# Patient Record
Sex: Male | Born: 1955 | Race: White | Hispanic: No | Marital: Married | Smoking: Former smoker
Health system: Southern US, Community
[De-identification: ages and names within clinical notes are randomized; demographics above are authoritative.]

## PROBLEM LIST (undated history)

## (undated) DIAGNOSIS — N529 Male erectile dysfunction, unspecified: Secondary | ICD-10-CM

## (undated) DIAGNOSIS — I10 Essential (primary) hypertension: Secondary | ICD-10-CM

## (undated) DIAGNOSIS — T7840XA Allergy, unspecified, initial encounter: Secondary | ICD-10-CM

## (undated) DIAGNOSIS — J45909 Unspecified asthma, uncomplicated: Secondary | ICD-10-CM

## (undated) DIAGNOSIS — J449 Chronic obstructive pulmonary disease, unspecified: Secondary | ICD-10-CM

## (undated) DIAGNOSIS — E119 Type 2 diabetes mellitus without complications: Secondary | ICD-10-CM

## (undated) DIAGNOSIS — M5431 Sciatica, right side: Secondary | ICD-10-CM

## (undated) HISTORY — DX: Type 2 diabetes mellitus without complications: E11.9

## (undated) HISTORY — PX: COLONOSCOPY: SHX174

## (undated) HISTORY — DX: Male erectile dysfunction, unspecified: N52.9

## (undated) HISTORY — DX: Sciatica, right side: M54.31

## (undated) HISTORY — DX: Essential (primary) hypertension: I10

## (undated) HISTORY — DX: Allergy, unspecified, initial encounter: T78.40XA

---

## 1988-03-04 DIAGNOSIS — Z87442 Personal history of urinary calculi: Secondary | ICD-10-CM

## 1988-03-04 HISTORY — DX: Personal history of urinary calculi: Z87.442

## 2015-12-06 DIAGNOSIS — N5203 Combined arterial insufficiency and corporo-venous occlusive erectile dysfunction: Secondary | ICD-10-CM | POA: Insufficient documentation

## 2017-06-25 LAB — HM COLONOSCOPY

## 2018-01-20 ENCOUNTER — Other Ambulatory Visit: Payer: Self-pay | Admitting: Acute Care

## 2018-01-20 DIAGNOSIS — R937 Abnormal findings on diagnostic imaging of other parts of musculoskeletal system: Secondary | ICD-10-CM

## 2018-01-20 DIAGNOSIS — M5441 Lumbago with sciatica, right side: Secondary | ICD-10-CM

## 2018-02-10 ENCOUNTER — Ambulatory Visit: Admission: RE | Admit: 2018-02-10 | Payer: BLUE CROSS/BLUE SHIELD | Source: Ambulatory Visit

## 2018-03-13 ENCOUNTER — Ambulatory Visit: Admission: RE | Admit: 2018-03-13 | Payer: BLUE CROSS/BLUE SHIELD | Source: Ambulatory Visit

## 2018-04-13 ENCOUNTER — Other Ambulatory Visit: Payer: Self-pay | Admitting: Acute Care

## 2018-04-13 DIAGNOSIS — R937 Abnormal findings on diagnostic imaging of other parts of musculoskeletal system: Secondary | ICD-10-CM

## 2018-04-13 DIAGNOSIS — M5440 Lumbago with sciatica, unspecified side: Secondary | ICD-10-CM

## 2018-04-13 DIAGNOSIS — M5442 Lumbago with sciatica, left side: Principal | ICD-10-CM

## 2018-04-13 DIAGNOSIS — G8929 Other chronic pain: Secondary | ICD-10-CM

## 2018-04-23 ENCOUNTER — Ambulatory Visit
Admission: RE | Admit: 2018-04-23 | Discharge: 2018-04-23 | Disposition: A | Discharge status: Home / Self Care | Payer: BLUE CROSS/BLUE SHIELD | Source: Ambulatory Visit | Attending: Acute Care | Admitting: Acute Care

## 2018-04-23 DIAGNOSIS — R937 Abnormal findings on diagnostic imaging of other parts of musculoskeletal system: Secondary | ICD-10-CM | POA: Insufficient documentation

## 2018-04-23 DIAGNOSIS — G8929 Other chronic pain: Secondary | ICD-10-CM

## 2018-04-23 DIAGNOSIS — M5442 Lumbago with sciatica, left side: Secondary | ICD-10-CM | POA: Diagnosis not present

## 2018-04-23 DIAGNOSIS — M5440 Lumbago with sciatica, unspecified side: Secondary | ICD-10-CM | POA: Diagnosis present

## 2018-09-03 LAB — HEPATIC FUNCTION PANEL
ALT: 33 (ref 10–40)
AST: 24 (ref 14–40)
Alkaline Phosphatase: 48 (ref 25–125)
Bilirubin, Total: 1.5

## 2018-09-03 LAB — BASIC METABOLIC PANEL
BUN: 11 (ref 4–21)
CO2: 27 — AB (ref 13–22)
Chloride: 96 — AB (ref 99–108)
Creatinine: 0.8 (ref 0.6–1.3)
Glucose: 122
Potassium: 4.3 (ref 3.4–5.3)
Sodium: 137 (ref 137–147)

## 2018-09-03 LAB — COMPREHENSIVE METABOLIC PANEL
Calcium: 9.9 (ref 8.7–10.7)
GFR calc non Af Amer: 95

## 2018-09-03 LAB — HEMOGLOBIN A1C: Hemoglobin A1C: 6.5

## 2019-07-20 ENCOUNTER — Encounter: Payer: Self-pay | Admitting: Family Medicine

## 2019-07-20 ENCOUNTER — Ambulatory Visit: Payer: BC Managed Care – PPO | Admitting: Family Medicine

## 2019-08-26 ENCOUNTER — Ambulatory Visit (INDEPENDENT_AMBULATORY_CARE_PROVIDER_SITE_OTHER): Payer: BC Managed Care – PPO | Admitting: Family Medicine

## 2019-08-26 ENCOUNTER — Other Ambulatory Visit: Payer: Self-pay

## 2019-08-26 ENCOUNTER — Ambulatory Visit: Payer: BC Managed Care – PPO | Admitting: Family Medicine

## 2019-08-26 ENCOUNTER — Encounter: Payer: Self-pay | Admitting: Family Medicine

## 2019-08-26 VITALS — BP 118/72 | HR 79 | Ht 70.0 in | Wt 178.0 lb

## 2019-08-26 DIAGNOSIS — J302 Other seasonal allergic rhinitis: Secondary | ICD-10-CM | POA: Insufficient documentation

## 2019-08-26 DIAGNOSIS — M5416 Radiculopathy, lumbar region: Secondary | ICD-10-CM

## 2019-08-26 DIAGNOSIS — I10 Essential (primary) hypertension: Secondary | ICD-10-CM | POA: Insufficient documentation

## 2019-08-26 DIAGNOSIS — Z7689 Persons encountering health services in other specified circumstances: Secondary | ICD-10-CM | POA: Diagnosis not present

## 2019-08-26 DIAGNOSIS — J449 Chronic obstructive pulmonary disease, unspecified: Secondary | ICD-10-CM | POA: Insufficient documentation

## 2019-08-26 DIAGNOSIS — E785 Hyperlipidemia, unspecified: Secondary | ICD-10-CM | POA: Insufficient documentation

## 2019-08-26 NOTE — Progress Notes (Signed)
Date:  08/26/2019   Name:  Bradley Bruce   DOB:  1955/12/28   MRN:  [id number]   Chief Complaint: Establish Care (New Patient. From [facility]) and Sciatica ([facility] told him they could do a surgery last christmas to help his sciatica but he was afraid and didn't do it at the time. Wants to know if he should still have the procedure. Gabapentin only helps him sleep but does not help the sciatica pain. Can be walking and gets stuck standing due to tingling and numbness in right leg and foot. )  Patient is a 64 year old male who presents for a establish care exam. The patient reports the following problems: lumbar radiculopathy/spondylothesis. Health maintenance has been reviewed up to date.  Back Pain This is a chronic problem. The current episode started more than 1 year ago. The problem occurs constantly. The problem has been waxing and waning since onset. The pain is present in the lumbar spine. The quality of the pain is described as aching. The pain radiates to the right foot, right knee and right thigh. The pain is moderate. The symptoms are aggravated by bending. Pertinent negatives include no abdominal pain, bladder incontinence, bowel incontinence, chest pain, dysuria, fever, headaches, numbness, paresis, paresthesias, tingling or weakness.    No results found for: CREATININE, BUN, NA, K, CL, CO2 No results found for: CHOL, HDL, LDLCALC, LDLDIRECT, TRIG, CHOLHDL No results found for: TSH No results found for: HGBA1C No results found for: WBC, HGB, HCT, MCV, PLT No results found for: ALT, AST, GGT, ALKPHOS, BILITOT   Review of Systems  Constitutional: Negative for chills and fever.  HENT: Negative for drooling, ear discharge, ear pain and sore throat.   Respiratory: Negative for cough, shortness of breath and wheezing.   Cardiovascular: Negative for chest pain, palpitations and leg swelling.  Gastrointestinal: Negative for abdominal pain, blood in stool, bowel incontinence,  constipation, diarrhea and nausea.  Endocrine: Negative for polydipsia.  Genitourinary: Negative for bladder incontinence, dysuria, frequency, hematuria and urgency.  Musculoskeletal: Positive for back pain. Negative for myalgias and neck pain.  Skin: Negative for rash.  Allergic/Immunologic: Negative for environmental allergies.  Neurological: Negative for dizziness, tingling, weakness, numbness, headaches and paresthesias.  Hematological: Does not bruise/bleed easily.  Psychiatric/Behavioral: Negative for suicidal ideas. The patient is not nervous/anxious.     Patient Active Problem List   Diagnosis Date Noted  . COPD (chronic obstructive pulmonary disease) ([place]) 08/26/2019  . Hyperlipidemia, unspecified 08/26/2019  . Hypertension 08/26/2019  . Seasonal allergies 08/26/2019  . Combined arterial insufficiency and corporo-venous occlusive erectile dysfunction 12/06/2015    No Known Allergies  History reviewed. No pertinent surgical history.  Social History   Tobacco Use  . Smoking status: Current Every Day Smoker    Packs/day: 1.00    Years: 46.00    Pack years: 46.00    Types: Cigarettes  . Smokeless tobacco: Never Used  Vaping Use  . Vaping Use: Never used  Substance Use Topics  . Alcohol use: Yes    Comment: rare- occasional beer  . Drug use: Never     Medication list has been reviewed and updated.  Current Meds  Medication Sig  . benazepril-hydrochlorthiazide (LOTENSIN HCT) 20-25 MG tablet Take 1 tablet by mouth daily.  . fluticasone (FLONASE) 50 MCG/ACT nasal spray Place 2 sprays into both nostrils daily.   Marland Kitchen gabapentin (NEURONTIN) 300 MG capsule Take 1 capsule by mouth at bedtime.  . metFORMIN (GLUCOPHAGE-XR) 750 MG 24  hr tablet Take 1 tablet by mouth daily.  Marland Kitchen omega-3 acid ethyl esters (LOVAZA) 1 g capsule Take 2 g by mouth 2 (two) times daily.  . sildenafil (REVATIO) 20 MG tablet Take 1-5 tablets by mouth as needed.    PHQ 2/9 Scores 08/26/2019  PHQ - 2  Score 0  PHQ- 9 Score 3    GAD 7 : Generalized Anxiety Score 08/26/2019  Nervous, Anxious, on Edge 0  Control/stop worrying 0  Worry too much - different things 0  Trouble relaxing 1  Restless 0  Easily annoyed or irritable 0  Afraid - awful might happen 0  Total GAD 7 Score 1  Anxiety Difficulty Not difficult at all    BP Readings from Last 3 Encounters:  08/26/19 118/72    Physical Exam Vitals and nursing note reviewed.  HENT:     Head: Normocephalic.     Right Ear: Tympanic membrane, ear canal and external ear normal.     Left Ear: Tympanic membrane, ear canal and external ear normal.     Nose: Nose normal.  Eyes:     General: No scleral icterus.       Right eye: No discharge.        Left eye: No discharge.     Conjunctiva/sclera: Conjunctivae normal.     Pupils: Pupils are equal, round, and reactive to light.  Neck:     Thyroid: No thyromegaly.     Vascular: No JVD.     Trachea: No tracheal deviation.  Cardiovascular:     Rate and Rhythm: Normal rate and regular rhythm.     Heart sounds: Normal heart sounds. No murmur heard.  No friction rub. No gallop.   Pulmonary:     Effort: No respiratory distress.     Breath sounds: Normal breath sounds. No wheezing or rales.  Abdominal:     General: Bowel sounds are normal.     Palpations: Abdomen is soft. There is no mass.     Tenderness: There is no abdominal tenderness. There is no guarding or rebound.  Musculoskeletal:        General: No tenderness. Normal range of motion.     Cervical back: Normal range of motion and neck supple.  Lymphadenopathy:     Cervical: No cervical adenopathy.  Skin:    General: Skin is warm.     Findings: No rash.  Neurological:     Mental Status: He is alert and oriented to person, place, and time.     Cranial Nerves: No cranial nerve deficit.     Deep Tendon Reflexes: Reflexes are normal and symmetric.     Wt Readings from Last 3 Encounters:  08/26/19 178 lb (80.7 kg)    BP  118/72 (BP Location: Left Arm, Patient Position: Sitting, Cuff Size: Normal)   Pulse 79   Ht 5\' 10"  (1.778 m)   Wt 178 lb (80.7 kg)   SpO2 96%   BMI 25.54 kg/m   Assessment and Plan: 1. Establishing care with new doctor, encounter for Patient establishing care with new physician.  Patient's chart was reviewed for previous encounters, most recent labs, most recent imaging, care everywhere specifically for lumbar radiculopathy/spondylolisthesis notes for Dr. Owens Shark.  2. Lumbar radiculopathy Chronic.  Episodic.  Waxes and wanes with current flares that results in inability to use right leg while walking.  This is very transient in the matter of seconds.  I suspect this has to do with radiculopathy.  We will refer  back to orthopedics at [facility] referral previous evaluations have been done as well as imaging and proceed with the pre-Covid plan of injections and possible consideration for surgery. - Ambulatory referral to Orthopedic Surgery

## 2019-09-08 ENCOUNTER — Encounter: Payer: BC Managed Care – PPO | Admitting: Family Medicine

## 2019-09-16 ENCOUNTER — Ambulatory Visit: Payer: BC Managed Care – PPO | Admitting: Family Medicine

## 2020-08-02 DIAGNOSIS — R0600 Dyspnea, unspecified: Secondary | ICD-10-CM

## 2020-08-02 DIAGNOSIS — C801 Malignant (primary) neoplasm, unspecified: Secondary | ICD-10-CM

## 2020-08-02 HISTORY — DX: Malignant (primary) neoplasm, unspecified: C80.1

## 2020-08-02 HISTORY — DX: Dyspnea, unspecified: R06.00

## 2020-08-22 ENCOUNTER — Ambulatory Visit: Payer: Medicare HMO | Admitting: Internal Medicine

## 2020-08-22 ENCOUNTER — Telehealth: Payer: Self-pay | Admitting: Internal Medicine

## 2020-08-22 ENCOUNTER — Encounter: Payer: Self-pay | Admitting: Internal Medicine

## 2020-08-22 ENCOUNTER — Other Ambulatory Visit: Payer: Self-pay

## 2020-08-22 VITALS — BP 98/60 | HR 77 | Temp 98.1°F | Ht 70.0 in | Wt 176.0 lb

## 2020-08-22 DIAGNOSIS — R918 Other nonspecific abnormal finding of lung field: Secondary | ICD-10-CM

## 2020-08-22 DIAGNOSIS — R059 Cough, unspecified: Secondary | ICD-10-CM

## 2020-08-22 NOTE — H&P (View-Only) (Signed)
Name: Bradley Bruce MRN: [id number] DOB: 08/18/1955     CONSULTATION DATE: 08/22/2020  REFERRING MD : Malvin Johns  CHIEF COMPLAINT: hemoptysis   HISTORY OF PRESENT ILLNESS: 65 year old pleasant white male seen today for abnormal CT chest report along with 72-month history of cough and hemoptysis  Patient was treated with multiple rounds of antibiotics and prednisone and cough persisted He was given a cough syrup but still had cough Primary care physician at [facility] and never been ordered CT chest  CT chest report indicates right suprahilar hilar mass extending into the right mainstem bronchus occluding the right upper lobe bronchus most likely consistent with malignancy adjacent lymph nodes could be involved as well Is also centrilobular and paraseptal emphysema Mass measuring 4 x 3 x 4 cm  These findings were reviewed in detail with the patient and family  Have advised patient that he will need another CT chest soon as possible to evaluate his lungs and will undergo bronchoscopy for further diagnostic evaluation  At this time I would treat this as a urgent situation and he will need to cancel his vacation  Patient with extensive smoking history 1.5 PPD for approx 50 years Stopped smoking 1 week ago  PAST MEDICAL HISTORY :   has a past medical history of Allergy, Diabetes mellitus without complication ([place]), ED (erectile dysfunction), Hypertension, and Sciatica of right side.  has no past surgical history on file. Prior to Admission medications   Medication Sig Start Date End Date Taking? Authorizing Provider  benazepril-hydrochlorthiazide (LOTENSIN HCT) 20-25 MG tablet Take 1 tablet by mouth daily. 05/31/19  Yes [provider]  fluticasone (FLONASE) 50 MCG/ACT nasal spray Place 2 sprays into both nostrils daily.  03/25/18  Yes [provider]  gabapentin (NEURONTIN) 300 MG capsule Take 1 capsule by mouth at bedtime. 06/21/19  Yes [provider]  metFORMIN  (GLUCOPHAGE-XR) 750 MG 24 hr tablet Take 1 tablet by mouth daily. 06/23/17  Yes [provider]  omega-3 acid ethyl esters (LOVAZA) 1 g capsule Take 2 g by mouth 2 (two) times daily.   Yes [provider]  sildenafil (REVATIO) 20 MG tablet Take 1-5 tablets by mouth as needed. 07/28/19  Yes [provider]   No Known Allergies  FAMILY HISTORY:  family history includes Cervical cancer in his mother; Heart disease in his father; Lung cancer in his sister. SOCIAL HISTORY:  reports that he has been smoking cigarettes. He has a 46.00 pack-year smoking history. He has never used smokeless tobacco. He reports current alcohol use. He reports that he does not use drugs.    Review of Systems:  Gen:  Denies  fever, sweats, chills weigh loss  HEENT: Denies blurred vision, double vision, ear pain, eye pain, hearing loss, nose bleeds, sore throat Cardiac:  No dizziness, chest pain or heaviness, chest tightness,edema, No JVD Resp:   +cough, -sputum production, +shortness of breath,+wheezing, +hemoptysis,  Gi: Denies swallowing difficulty, stomach pain, nausea or vomiting, diarrhea, constipation, bowel incontinence Gu:  Denies bladder incontinence, burning urine Ext:   Denies Joint pain, stiffness or swelling Skin: Denies  skin rash, easy bruising or bleeding or hives Endoc:  Denies polyuria, polydipsia , polyphagia or weight change Psych:   Denies depression, insomnia or hallucinations  Other:  All other systems negative   BP 98/60 (BP Location: Left Arm, Patient Position: Sitting, Cuff Size: Normal)   Pulse 77   Temp 98.1 F (36.7 C) (Temporal)   Ht 5\' 10"  (1.778 m)  Wt 176 lb (79.8 kg)   SpO2 95%   BMI 25.25 kg/m     SpO2: 95 % O2 Device: None (Room air)    Physical Examination:   GENERAL:NAD, no fevers, chills, no weakness no fatigue HEAD: Normocephalic, atraumatic.  EYES: PERLA, EOMI No scleral icterus.  NECK: Supple.  PULMONARY: CTA B/L +wheezing,   CARDIOVASCULAR: S1 and S2. Regular rate and rhythm. No murmurs GASTROINTESTINAL: Soft, nontender, nondistended. Positive bowel sounds.  MUSCULOSKELETAL: No swelling, clubbing, or edema.  NEUROLOGIC: No gross focal neurological deficits. 5/5 strength all extremities SKIN: No ulceration, lesions, rashes, or cyanosis.  PSYCHIATRIC: Insight, judgment intact. -depression -anxiety ALL OTHER ROS ARE NEGATIVE   MEDICATIONS: I have reviewed all medications and confirmed regimen as documented     ASSESSMENT AND PLAN SYNOPSIS  65 year old white male seen today for hemoptysis with abnormal report of CT chest showing right mainstem mass suprahilar mass with obstructive physiology in the right upper lobe based on CT scan reports from [facility] most likely representing a primary lung cancer related to his smoking  Patient will need an urgent CT scan of his chest at this time as I am unable to obtain images from his CT his chest  Patient will then probably need bronchoscopy for further diagnostic evaluation     Patient/Family are satisfied with Plan of action and management. All questions answered   Total time spent 47 minutes   Corrin Parker, M.D.  [facility]  Medical Director [place] Director [facility] Department

## 2020-08-22 NOTE — Patient Instructions (Signed)
Recommend repeat CT chest to soon as possible

## 2020-08-22 NOTE — Progress Notes (Addendum)
Name: Bradley Bruce MRN: [id number] DOB: 05-26-1955     CONSULTATION DATE: 08/22/2020  REFERRING MD : Malvin Johns  CHIEF COMPLAINT: hemoptysis   HISTORY OF PRESENT ILLNESS: 65 year old pleasant white male seen today for abnormal CT chest report along with 22-month history of cough and hemoptysis  Patient was treated with multiple rounds of antibiotics and prednisone and cough persisted He was given a cough syrup but still had cough Primary care physician at [facility] and never been ordered CT chest  CT chest report indicates right suprahilar hilar mass extending into the right mainstem bronchus occluding the right upper lobe bronchus most likely consistent with malignancy adjacent lymph nodes could be involved as well Is also centrilobular and paraseptal emphysema Mass measuring 4 x 3 x 4 cm  These findings were reviewed in detail with the patient and family  Have advised patient that he will need another CT chest soon as possible to evaluate his lungs and will undergo bronchoscopy for further diagnostic evaluation  At this time I would treat this as a urgent situation and he will need to cancel his vacation  Patient with extensive smoking history 1.5 PPD for approx 50 years Stopped smoking 1 week ago  PAST MEDICAL HISTORY :   has a past medical history of Allergy, Diabetes mellitus without complication ([place]), ED (erectile dysfunction), Hypertension, and Sciatica of right side.  has no past surgical history on file. Prior to Admission medications   Medication Sig Start Date End Date Taking? Authorizing Provider  benazepril-hydrochlorthiazide (LOTENSIN HCT) 20-25 MG tablet Take 1 tablet by mouth daily. 05/31/19  Yes [provider]  fluticasone (FLONASE) 50 MCG/ACT nasal spray Place 2 sprays into both nostrils daily.  03/25/18  Yes [provider]  gabapentin (NEURONTIN) 300 MG capsule Take 1 capsule by mouth at bedtime. 06/21/19  Yes [provider]  metFORMIN  (GLUCOPHAGE-XR) 750 MG 24 hr tablet Take 1 tablet by mouth daily. 06/23/17  Yes [provider]  omega-3 acid ethyl esters (LOVAZA) 1 g capsule Take 2 g by mouth 2 (two) times daily.   Yes [provider]  sildenafil (REVATIO) 20 MG tablet Take 1-5 tablets by mouth as needed. 07/28/19  Yes [provider]   No Known Allergies  FAMILY HISTORY:  family history includes Cervical cancer in his mother; Heart disease in his father; Lung cancer in his sister. SOCIAL HISTORY:  reports that he has been smoking cigarettes. He has a 46.00 pack-year smoking history. He has never used smokeless tobacco. He reports current alcohol use. He reports that he does not use drugs.    Review of Systems:  Gen:  Denies  fever, sweats, chills weigh loss  HEENT: Denies blurred vision, double vision, ear pain, eye pain, hearing loss, nose bleeds, sore throat Cardiac:  No dizziness, chest pain or heaviness, chest tightness,edema, No JVD Resp:   +cough, -sputum production, +shortness of breath,+wheezing, +hemoptysis,  Gi: Denies swallowing difficulty, stomach pain, nausea or vomiting, diarrhea, constipation, bowel incontinence Gu:  Denies bladder incontinence, burning urine Ext:   Denies Joint pain, stiffness or swelling Skin: Denies  skin rash, easy bruising or bleeding or hives Endoc:  Denies polyuria, polydipsia , polyphagia or weight change Psych:   Denies depression, insomnia or hallucinations  Other:  All other systems negative   BP 98/60 (BP Location: Left Arm, Patient Position: Sitting, Cuff Size: Normal)   Pulse 77   Temp 98.1 F (36.7 C) (Temporal)   Ht 5\' 10"  (1.778 m)  Wt 176 lb (79.8 kg)   SpO2 95%   BMI 25.25 kg/m     SpO2: 95 % O2 Device: None (Room air)    Physical Examination:   GENERAL:NAD, no fevers, chills, no weakness no fatigue HEAD: Normocephalic, atraumatic.  EYES: PERLA, EOMI No scleral icterus.  NECK: Supple.  PULMONARY: CTA B/L +wheezing,   CARDIOVASCULAR: S1 and S2. Regular rate and rhythm. No murmurs GASTROINTESTINAL: Soft, nontender, nondistended. Positive bowel sounds.  MUSCULOSKELETAL: No swelling, clubbing, or edema.  NEUROLOGIC: No gross focal neurological deficits. 5/5 strength all extremities SKIN: No ulceration, lesions, rashes, or cyanosis.  PSYCHIATRIC: Insight, judgment intact. -depression -anxiety ALL OTHER ROS ARE NEGATIVE   MEDICATIONS: I have reviewed all medications and confirmed regimen as documented     ASSESSMENT AND PLAN SYNOPSIS  65 year old white male seen today for hemoptysis with abnormal report of CT chest showing right mainstem mass suprahilar mass with obstructive physiology in the right upper lobe based on CT scan reports from [facility] most likely representing a primary lung cancer related to his smoking  Patient will need an urgent CT scan of his chest at this time as I am unable to obtain images from his CT his chest  Patient will then probably need bronchoscopy for further diagnostic evaluation     Patient/Family are satisfied with Plan of action and management. All questions answered   Total time spent 47 minutes   Corrin Parker, M.D.  [facility]  Medical Director [place] Director [place] Department

## 2020-08-22 NOTE — Telephone Encounter (Signed)
EBUS has been scheduled for 08/30/2020. VW:AQLR mass CPT: [id number], [id number]  PET scan has been ordered. Dr. Mortimer Fries has made patient aware.   Rodena Piety, please see bronch info.

## 2020-08-23 NOTE — Telephone Encounter (Signed)
Covid test 08/28/2020 between 8-12 and phone pre admit 08/25/2020 between 1-5.  Patient is aware of dates/times and voiced his understanding.  Nothing further needed at this time.

## 2020-08-23 NOTE — Telephone Encounter (Signed)
Patient is aware of date/time of bronch and voiced his understanding.

## 2020-08-23 NOTE — Telephone Encounter (Signed)
While I was getting the PET scan approved I spoke with [employer] and no PA is required through them for code 31652, [phone]  Refer # [id number]  I did call [facility] and they stated that PA not required for codes 31652, [id number] Refer # [id number]

## 2020-08-23 NOTE — Telephone Encounter (Signed)
Helene Kelp calling on behalf of pt. States that MyChart is showing a procedure scheduled for June 29th. Also received as phone call saying it was a bronchioscope with ultrasound. Pt and wife would like someone to call back to know what time to arrive and if there is anything they need to do to prepare for this procedure.

## 2020-08-25 ENCOUNTER — Encounter
Admission: RE | Admit: 2020-08-25 | Discharge: 2020-08-25 | Disposition: A | Discharge status: Home / Self Care | Payer: Medicare HMO | Source: Ambulatory Visit | Attending: Internal Medicine | Admitting: Internal Medicine

## 2020-08-25 ENCOUNTER — Other Ambulatory Visit: Payer: Self-pay

## 2020-08-25 DIAGNOSIS — R5383 Other fatigue: Secondary | ICD-10-CM

## 2020-08-25 DIAGNOSIS — R042 Hemoptysis: Secondary | ICD-10-CM

## 2020-08-25 DIAGNOSIS — R053 Chronic cough: Secondary | ICD-10-CM

## 2020-08-25 HISTORY — DX: Hemoptysis: R04.2

## 2020-08-25 HISTORY — DX: Chronic cough: R05.3

## 2020-08-25 HISTORY — DX: Other fatigue: R53.83

## 2020-08-25 HISTORY — DX: Unspecified asthma, uncomplicated: J45.909

## 2020-08-25 HISTORY — DX: Chronic obstructive pulmonary disease, unspecified: J44.9

## 2020-08-25 NOTE — Patient Instructions (Signed)
INSTRUCTIONS FOR SURGERY     Your surgery is scheduled for:   Wednesday, June 29TH     To find out your arrival time for the day of surgery,          please call [phone] between 1 pm and 3 pm on :  Tuesday, June 28TH  COVID TEST AND BLOOD WORK ON Monday, June 27TH     When you arrive for surgery, report to the [place]. ONCE THEY HAVE COMPLETED THEIR PROCESS, PROCEED TO THE SECOND FLOOR AND SIGN IN AT THE SURGERY DESK.    REMEMBER: Instructions that are not followed completely may result in serious medical risk,  up to and including death, or upon the discretion of your surgeon and anesthesiologist,            your surgery may need to be rescheduled.  __X__ 1. Do not eat food after midnight the night before your procedure.                    No gum, candy, lozenger, tic tacs, tums or hard candies.                  ABSOLUTELY NOTHING SOLID IN YOUR MOUTH AFTER MIDNIGHT                    You may drink unlimited clear liquids up to 2 hours before you are scheduled to arrive for surgery.                   Do not drink anything within those 2 hours unless you need to take medicine, then take the                   smallest amount you need.  Clear liquids include:  water, apple juice without pulp,                   any flavor Gatorade, Black coffee, black tea.  Sugar may be added but no dairy/ honey /lemon.                        Broth and jello is not considered a clear liquid.  __x__  2. On the morning of surgery, please brush your teeth with toothpaste and water. You may rinse with                  mouthwash if you wish but DO NOT SWALLOW TOOTHPASTE OR MOUTHWASH.  __X___3. NO alcohol for 24 hours before or after surgery.  __x___ 4.  Do NOT smoke or use e-cigarettes for 24 HOURS PRIOR TO SURGERY.                      DO NOT  Use any chewable tobacco products for at least 6 hours  prior to surgery.  __x___ 5. If you start any new medication after this appointment and prior to surgery, please                   Bring it  with you on the day of surgery.  ___x__ 6. Notify your doctor if there is any change in your medical condition, such as fever,                   infection, vomitting,diarrhea or any open sores.  __x___ 7.  USE ANTIBACTERIAL SOAP  as instructed, the night before surgery OR the day of surgery.                   Once you have washed with this soap, do NOT use any of the following: Powders, perfumes                    or lotions. Please do not wear make up, hairpins, clips or nail polish. You may wear deodorant.                                                          Men may shave their face and neck.                     DO NOT wear ANY jewelry on the day of surgery. If there are rings that are too tight to                    remove easily, please address this prior to the surgery day. Piercings need to be removed.                                                                     NO METAL ON YOUR BODY.                    Do NOT bring any valuables.  If you came to Pre-Admit testing then you will not need license,                     insurance card or credit card.  If you will be staying overnight, please either leave your things in                     the car or have your family be responsible for these items.                     [facility] IS NOT RESPONSIBLE FOR BELONGINGS OR VALUABLES.  ___X__ 8. DO NOT wear contact lenses on surgery day.  You may not have dentures,                     Hearing aides, contacts or glasses in the operating room. These items can be                    Placed in the Recovery Room to receive immediately after surgery.  __x___ 9. IF YOU ARE SCHEDULED TO GO HOME ON THE SAME DAY, YOU MUST                   Have someone to drive  you home and to stay with you  for the first 24 hours.                    Have an arrangement prior  to arriving on surgery day.  ___x__ 10. Take the following medications on the morning of surgery with a sip of water:                              1. FLONASE                     2. ADVAIR                     3.  __X__  12. STOP ALL ASPIRIN PRODUCTS AS OF TODAY, 08/25/20                       THIS INCLUDES BC POWDERS / GOODIES POWDER  __x___ 13. STOP Anti-inflammatories as of TODAY, 08/25/20                      This includes IBUPROFEN / MOTRIN / ADVIL / ALEVE/ NAPROXYN                    YOU MAY TAKE TYLENOL ANY TIME PRIOR TO SURGERY.  ___X__ 14.  Stop supplements until after surgery.                     This includes: LOVAZA // APPLE CIDER VINEGAR // VITAMIN D                   ___X___16.  Stop Metformin 2 full days prior to surgery.  LAST DOSE: Sunday pm 08/27/20.                     Do NOT take any diabetes medications on surgery day.  ___x___17.  Continue to take the following medications but do not take on the morning of surgery:                                  LOTENSIN //   ___X___18. Wear clean and comfortable clothing to the hospital.

## 2020-08-28 ENCOUNTER — Other Ambulatory Visit: Payer: Self-pay

## 2020-08-28 ENCOUNTER — Other Ambulatory Visit
Admission: RE | Admit: 2020-08-28 | Discharge: 2020-08-28 | Disposition: A | Discharge status: Home / Self Care | Payer: Medicare HMO | Source: Ambulatory Visit | Attending: Internal Medicine | Admitting: Internal Medicine

## 2020-08-28 DIAGNOSIS — Z20822 Contact with and (suspected) exposure to covid-19: Secondary | ICD-10-CM | POA: Insufficient documentation

## 2020-08-28 DIAGNOSIS — I10 Essential (primary) hypertension: Secondary | ICD-10-CM | POA: Insufficient documentation

## 2020-08-28 DIAGNOSIS — Z01818 Encounter for other preprocedural examination: Secondary | ICD-10-CM | POA: Insufficient documentation

## 2020-08-28 DIAGNOSIS — Z01810 Encounter for preprocedural cardiovascular examination: Secondary | ICD-10-CM

## 2020-08-28 LAB — CBC
HCT: 38.2 % — ABNORMAL LOW (ref 39.0–52.0)
Hemoglobin: 13.5 g/dL (ref 13.0–17.0)
MCH: 30.1 pg (ref 26.0–34.0)
MCHC: 35.3 g/dL (ref 30.0–36.0)
MCV: 85.3 fL (ref 80.0–100.0)
Platelets: 283 10*3/uL (ref 150–400)
RBC: 4.48 MIL/uL (ref 4.22–5.81)
RDW: 11.9 % (ref 11.5–15.5)
WBC: 8 10*3/uL (ref 4.0–10.5)
nRBC: 0 % (ref 0.0–0.2)

## 2020-08-28 LAB — BASIC METABOLIC PANEL
Anion gap: 7 (ref 5–15)
BUN: 18 mg/dL (ref 8–23)
CO2: 28 mmol/L (ref 22–32)
Calcium: 9.2 mg/dL (ref 8.9–10.3)
Chloride: 100 mmol/L (ref 98–111)
Creatinine, Ser: 0.67 mg/dL (ref 0.61–1.24)
GFR, Estimated: >60 mL/min (ref >60)
Glucose, Bld: 138 mg/dL — ABNORMAL HIGH (ref 70–99)
Potassium: 3.2 mmol/L — ABNORMAL LOW (ref 3.5–5.1)
Sodium: 135 mmol/L (ref 135–145)

## 2020-08-28 LAB — SARS CORONAVIRUS 2 (TAT 6-24 HRS): SARS Coronavirus 2: NEGATIVE

## 2020-08-28 MED ORDER — ALBUTEROL SULFATE HFA 108 (90 BASE) MCG/ACT IN AERS: 2.0000 | INHALATION_SPRAY | Freq: Four times a day (QID) | RESPIRATORY_TRACT | 2 refills | Status: DC | PRN

## 2020-08-28 MED ORDER — PREDNISONE 20 MG PO TABS: 40.0000 mg | ORAL_TABLET | Freq: Every day | ORAL | 0 refills | Status: DC

## 2020-08-28 NOTE — Telephone Encounter (Signed)
[facility] [address]  Will this effect wednsday procedure should I not take it 24 hours before procedure  Dr. Mortimer Fries, please advise.

## 2020-08-28 NOTE — Telephone Encounter (Signed)
Dr. Mortimer Fries, please advise. Thanks

## 2020-08-28 NOTE — Telephone Encounter (Signed)
LM x1 for patient.

## 2020-08-29 ENCOUNTER — Ambulatory Visit
Admission: RE | Admit: 2020-08-29 | Discharge: 2020-08-29 | Disposition: A | Discharge status: Home / Self Care | Payer: Self-pay | Source: Ambulatory Visit | Attending: Internal Medicine | Admitting: Internal Medicine

## 2020-08-29 ENCOUNTER — Other Ambulatory Visit: Payer: Self-pay | Admitting: Internal Medicine

## 2020-08-29 DIAGNOSIS — R918 Other nonspecific abnormal finding of lung field: Secondary | ICD-10-CM

## 2020-08-30 ENCOUNTER — Encounter: Payer: Self-pay | Admitting: Internal Medicine

## 2020-08-30 ENCOUNTER — Encounter
Admission: RE | Disposition: A | Discharge status: Home / Self Care | Payer: Self-pay | Source: Home / Self Care | Attending: Internal Medicine

## 2020-08-30 ENCOUNTER — Ambulatory Visit: Payer: Medicare HMO | Admitting: Anesthesiology

## 2020-08-30 ENCOUNTER — Other Ambulatory Visit: Payer: Self-pay

## 2020-08-30 ENCOUNTER — Ambulatory Visit
Admission: RE | Admit: 2020-08-30 | Discharge: 2020-08-30 | Disposition: A | Discharge status: Home / Self Care | Payer: Medicare HMO | Attending: Internal Medicine | Admitting: Internal Medicine

## 2020-08-30 ENCOUNTER — Encounter: Payer: Self-pay | Admitting: *Deleted

## 2020-08-30 DIAGNOSIS — C3401 Malignant neoplasm of right main bronchus: Secondary | ICD-10-CM | POA: Diagnosis not present

## 2020-08-30 DIAGNOSIS — R918 Other nonspecific abnormal finding of lung field: Secondary | ICD-10-CM

## 2020-08-30 DIAGNOSIS — Z7984 Long term (current) use of oral hypoglycemic drugs: Secondary | ICD-10-CM | POA: Diagnosis not present

## 2020-08-30 DIAGNOSIS — R042 Hemoptysis: Secondary | ICD-10-CM | POA: Diagnosis present

## 2020-08-30 DIAGNOSIS — F17210 Nicotine dependence, cigarettes, uncomplicated: Secondary | ICD-10-CM | POA: Insufficient documentation

## 2020-08-30 DIAGNOSIS — Z79899 Other long term (current) drug therapy: Secondary | ICD-10-CM | POA: Insufficient documentation

## 2020-08-30 DIAGNOSIS — J432 Centrilobular emphysema: Secondary | ICD-10-CM | POA: Insufficient documentation

## 2020-08-30 DIAGNOSIS — Z801 Family history of malignant neoplasm of trachea, bronchus and lung: Secondary | ICD-10-CM | POA: Insufficient documentation

## 2020-08-30 DIAGNOSIS — C3491 Malignant neoplasm of unspecified part of right bronchus or lung: Secondary | ICD-10-CM

## 2020-08-30 HISTORY — PX: VIDEO BRONCHOSCOPY WITH ENDOBRONCHIAL ULTRASOUND: SHX6177

## 2020-08-30 LAB — GLUCOSE, CAPILLARY
Glucose-Capillary: 98 mg/dL (ref 70–99)
Glucose-Capillary: 128 mg/dL — ABNORMAL HIGH (ref 70–99)

## 2020-08-30 SURGERY — BRONCHOSCOPY, WITH EBUS
Anesthesia: General | Laterality: Right

## 2020-08-30 MED ORDER — FAMOTIDINE 20 MG PO TABS: 20.0000 mg | ORAL_TABLET | Freq: Once | ORAL | Status: AC

## 2020-08-30 MED ORDER — PROPOFOL 10 MG/ML IV BOLUS
INTRAVENOUS | Status: AC
  Filled 2020-08-30: qty 20

## 2020-08-30 MED ORDER — CHLORHEXIDINE GLUCONATE 0.12 % MT SOLN: 15.0000 mL | Freq: Once | OROMUCOSAL | Status: DC

## 2020-08-30 MED ORDER — MIDAZOLAM HCL 2 MG/2ML IJ SOLN
INTRAMUSCULAR | Status: AC
  Filled 2020-08-30: qty 2

## 2020-08-30 MED ORDER — ORAL CARE MOUTH RINSE: 15.0000 mL | Freq: Once | OROMUCOSAL | Status: DC

## 2020-08-30 MED ORDER — PROPOFOL 10 MG/ML IV BOLUS
INTRAVENOUS | Status: DC | PRN
  Administered 2020-08-30: 150 mg via INTRAVENOUS

## 2020-08-30 MED ORDER — ONDANSETRON HCL 4 MG/2ML IJ SOLN
INTRAMUSCULAR | Status: DC | PRN
  Administered 2020-08-30: 4 mg via INTRAVENOUS

## 2020-08-30 MED ORDER — SODIUM CHLORIDE 0.9 % IV SOLN: INTRAVENOUS | Status: DC

## 2020-08-30 MED ORDER — FENTANYL CITRATE (PF) 100 MCG/2ML IJ SOLN
INTRAMUSCULAR | Status: DC | PRN
  Administered 2020-08-30: 50 ug via INTRAVENOUS

## 2020-08-30 MED ORDER — FENTANYL CITRATE (PF) 100 MCG/2ML IJ SOLN: 25.0000 ug | INTRAMUSCULAR | Status: DC | PRN

## 2020-08-30 MED ORDER — OXYCODONE HCL 5 MG/5ML PO SOLN: 5.0000 mg | Freq: Once | ORAL | Status: DC | PRN

## 2020-08-30 MED ORDER — CHLORHEXIDINE GLUCONATE 0.12 % MT SOLN
OROMUCOSAL | Status: AC
  Administered 2020-08-30: 15 mL
  Filled 2020-08-30: qty 15

## 2020-08-30 MED ORDER — FAMOTIDINE 20 MG PO TABS
ORAL_TABLET | ORAL | Status: AC
  Administered 2020-08-30: 20 mg via ORAL
  Filled 2020-08-30: qty 1

## 2020-08-30 MED ORDER — FENTANYL CITRATE (PF) 100 MCG/2ML IJ SOLN
INTRAMUSCULAR | Status: AC
  Filled 2020-08-30: qty 2

## 2020-08-30 MED ORDER — ROCURONIUM BROMIDE 100 MG/10ML IV SOLN
INTRAVENOUS | Status: DC | PRN
  Administered 2020-08-30: 50 mg via INTRAVENOUS

## 2020-08-30 MED ORDER — MIDAZOLAM HCL 2 MG/2ML IJ SOLN
INTRAMUSCULAR | Status: DC | PRN
  Administered 2020-08-30: 2 mg via INTRAVENOUS

## 2020-08-30 MED ORDER — PHENYLEPHRINE HCL (PRESSORS) 10 MG/ML IV SOLN
INTRAVENOUS | Status: DC | PRN
  Administered 2020-08-30: 100 ug via INTRAVENOUS

## 2020-08-30 MED ORDER — OXYCODONE HCL 5 MG PO TABS: 5.0000 mg | ORAL_TABLET | Freq: Once | ORAL | Status: DC | PRN

## 2020-08-30 MED ORDER — DEXAMETHASONE SODIUM PHOSPHATE 10 MG/ML IJ SOLN
INTRAMUSCULAR | Status: DC | PRN
  Administered 2020-08-30: 10 mg via INTRAVENOUS

## 2020-08-30 MED ORDER — SUGAMMADEX SODIUM 200 MG/2ML IV SOLN
INTRAVENOUS | Status: DC | PRN
  Administered 2020-08-30: 400 mg via INTRAVENOUS

## 2020-08-30 MED ORDER — LIDOCAINE HCL (CARDIAC) PF 100 MG/5ML IV SOSY
PREFILLED_SYRINGE | INTRAVENOUS | Status: DC | PRN
  Administered 2020-08-30: 100 mg via INTRAVENOUS

## 2020-08-30 NOTE — Transfer of Care (Signed)
Immediate Anesthesia Transfer of Care Note  Patient: Bradley Bruce  Procedure(s) Performed: VIDEO BRONCHOSCOPY WITH ENDOBRONCHIAL ULTRASOUND (Right)  Patient Location: PACU  Anesthesia Type:General  Level of Consciousness: drowsy  Airway & Oxygen Therapy: Patient Spontanous Breathing and Patient connected to face mask oxygen  Post-op Assessment: Report given to RN  Post vital signs: stable  Last Vitals:  Vitals Value Taken Time  BP 138/89 08/30/20 1318  Temp    Pulse 77 08/30/20 1322  Resp 21 08/30/20 1322  SpO2 97 % 08/30/20 1322  Vitals shown include unvalidated device data.  Last Pain:  Vitals:   08/30/20 1057  TempSrc: Oral      Patients Stated Pain Goal: 0 (09/81/19 1478)  Complications: No notable events documented.

## 2020-08-30 NOTE — Anesthesia Procedure Notes (Signed)
Procedure Name: Intubation Date/Time: 08/30/2020 12:49 PM Performed by: Lerry Liner, CRNA Pre-anesthesia Checklist: Patient identified, Emergency Drugs available, Suction available and Patient being monitored Patient Re-evaluated:Patient Re-evaluated prior to induction Oxygen Delivery Method: Circle system utilized Preoxygenation: Pre-oxygenation with 100% oxygen Induction Type: IV induction Ventilation: Mask ventilation without difficulty Laryngoscope Size: McGraph and 4 Grade View: Grade II Tube type: Oral Tube size: 8.5 mm Number of attempts: 1 Airway Equipment and Method: Stylet and Oral airway Placement Confirmation: ETT inserted through vocal cords under direct vision, positive ETCO2 and breath sounds checked- equal and bilateral Secured at: 23 cm Tube secured with: Tape Dental Injury: Teeth and Oropharynx as per pre-operative assessment

## 2020-08-30 NOTE — Interval H&P Note (Signed)
History and Physical Interval Note:  08/30/2020 12:12 PM  Bradley Bruce  has presented today for surgery, with the diagnosis of LUNG MASS.  The various methods of treatment have been discussed with the patient and family. After consideration of risks, benefits and other options for treatment, the patient has consented to  Procedure(s): [place] (Right) BRONCHOSCOPY as a surgical intervention.  The patient's history has been reviewed, patient examined, no change in status, stable for surgery.  I have reviewed the patient's chart and labs.  Questions were answered to the patient's satisfaction.      The Risks and Benefits of the Bronchoscopy procedure with EBUS and BRONCHOSCOPY  were explained to patient/family.  I have discussed the risk for Acute Bleeding, increased chance of Infection, increased chance of Respiratory Failure and Cardiac Arrest, increased chance of pneumothorax and collapsed lung, as well as increased Stroke and Death.  I have also explained to avoid all types of NSAIDs 1 week prior to procedure date  to decrease chance of bleeding, and also to avoid food and drinks the midnight prior to procedure.  The procedure consists of a video camera with a light source to be placed and inserted  into the lungs to  look for abnormal tissue and to obtain tissue samples by using needle and biopsy tools.  The patient/family understand the risks and benefits and have agreed to proceed with procedure.   Flora Lipps

## 2020-08-30 NOTE — CV Procedure (Signed)
  PROCEDURE: BRONCHOSCOPY Therapeutic Aspiration of Tracheobronchial Tree  PROCEDURE DATE: 08/30/2020  TIME:  NAMEBekim Bruce  DOB:1955/09/28  MRN: [id number]    Indications/Preliminary Diagnosis: RT LUNG MASS  Consent: (Place X beside choice/s below)  The benefits, risks and possible complications of the procedure were        explained to:  __x_ patient  __x_ patient's family  ___ other:___________  who verbalized understanding and gave:  ___ verbal  ___ written  __x_ verbal and written  ___ telephone  ___ other:________ consent.      Unable to obtain consent; procedure performed on emergent basis.     Other:       PRESEDATION ASSESSMENT: History and Physical has been performed. Patient meds and allergies have been reviewed. Presedation airway examination has been performed and documented. Baseline vital signs, sedation score, oxygenation status, and cardiac rhythm were reviewed. Patient was deemed to be in satisfactory condition to undergo the procedure.   SEE ANETHSEIOLOGY RECORDS      PROCEDURE DETAILS: Timeout performed and correct patient, name, & ID confirmed. Following prep per Pulmonary policy, appropriate sedation was administered. The Bronchoscope was inserted in to oral cavity with bite block in place. Therapeutic aspiration of Tracheobronchial tree was performed.  Airway exam proceeded with findings, technical procedures, and specimen collection as noted below. At the end of exam the scope was withdrawn without incident. Impression and Plan as noted below.           Airway Prep (Place X beside choice below)   1% Transtracheal Lidocaine Anesthetization 7 cc  x Patient prepped per Bronchoscopy Lab Policy       Insertion Route (Place X beside choice below)   Nasal   Oral  x Endotracheal Tube   Tracheostomy   INTRAPROCEDURE MEDICATIONS:  Sedative/Narcotic Amt Dose   Versed  mg   EPI 6 cc's   COLD SALINE 40 cc's         TECHNICAL PROCEDURES:  (Place X beside choice below)   Procedures  Description    None     Electrocautery     Cryotherapy     Balloon Dilatation     Bronchography     Stent Placement     Therapeutic Aspiration     Laser/Argon Plasma    Brachytherapy Catheter Placement    Foreign Body Removal         SPECIMENS (Sites): (Place X beside choice below)  Specimens Description   No Specimens Obtained     Washings    Lavage   x Biopsies 5 endobronchial biopsies   Fine Needle Aspirates   x Brushings 2 brushings   Sputum    FINDINGS: large endobronchial RT main stem mass extending into RBI-friable, easliy bleeds,+inflammation RT UPPER LOBE is 100% occluded by mass  ESTIMATED BLOOD LOSS: 759 cc's  COMPLICATIONS/RESOLUTION: none   IMPRESSION:POST-PROCEDURE DX:  RT Main Stem endobronchial Mass   RECOMMENDATION/PLAN:   Needs Oncology evaluation ASAP Follow up Path results  See pics below           Maretta Bees Patricia Pesa, M.D.  [facility]  Medical Director [place] Director [place] Department

## 2020-08-30 NOTE — Discharge Instructions (Signed)

## 2020-08-30 NOTE — Anesthesia Postprocedure Evaluation (Signed)
Anesthesia Post Note  Patient: Wilian Kwong  Procedure(s) Performed: VIDEO BRONCHOSCOPY WITH ENDOBRONCHIAL ULTRASOUND (Right)  Patient location during evaluation: PACU Anesthesia Type: General Level of consciousness: awake and alert Pain management: pain level controlled Vital Signs Assessment: post-procedure vital signs reviewed and stable Respiratory status: spontaneous breathing, nonlabored ventilation, respiratory function stable and patient connected to nasal cannula oxygen Cardiovascular status: blood pressure returned to baseline and stable Postop Assessment: no apparent nausea or vomiting Anesthetic complications: no   No notable events documented.   Last Vitals:  Vitals:   08/30/20 1400 08/30/20 1407  BP: 123/81 96/67  Pulse: 73 78  Resp: (!) 21 20  Temp: 36.6 C 36.7 C  SpO2: 92% 94%    Last Pain:  Vitals:   08/30/20 1407  TempSrc:   PainSc: 0-No pain                 Precious Haws Honorio Devol

## 2020-08-30 NOTE — Anesthesia Preprocedure Evaluation (Signed)
Anesthesia Evaluation  Patient identified by MRN, date of birth, ID band Patient awake    Reviewed: Allergy & Precautions, NPO status , Patient's Chart, lab work & pertinent test results  History of Anesthesia Complications Negative for: history of anesthetic complications  Airway Mallampati: III  TM Distance: <3 FB Neck ROM: full    Dental  (+) Chipped, Poor Dentition, Missing   Pulmonary shortness of breath and with exertion, asthma , COPD, former smoker,    Pulmonary exam normal        Cardiovascular Exercise Tolerance: Good hypertension, (-) angina(-) Past MI and (-) DOE Normal cardiovascular exam     Neuro/Psych  Neuromuscular disease negative psych ROS   GI/Hepatic negative GI ROS, Neg liver ROS,   Endo/Other  diabetes, Type 2, Oral Hypoglycemic Agents  Renal/GU      Musculoskeletal   Abdominal   Peds  Hematology negative hematology ROS (+)   Anesthesia Other Findings Past Medical History: No date: Allergy No date: Asthma 08/2020: Cancer [facility])     Comment:  lung mass probably metastatic 08/25/2020: Chronic cough No date: COPD (chronic obstructive pulmonary disease) (HCC) No date: Diabetes mellitus without complication ([place]) 59/9357: Dyspnea     Comment:  worsening now No date: ED (erectile dysfunction) 08/25/2020: Fatigue 08/25/2020: Hemoptysis     Comment:  still continues for a few days, then stops for a few               days and then starts up again 1990: History of kidney stones     Comment:  passed stone without surgery No date: Hypertension No date: Sciatica of right side     Comment:  Takes gabapentin to help  Past Surgical History: No date: COLONOSCOPY  BMI    Body Mass Index: 24.39 kg/m      Reproductive/Obstetrics negative OB ROS                             Anesthesia Physical Anesthesia Plan  ASA: 3  Anesthesia Plan: General ETT   Post-op Pain  Management:    Induction: Intravenous  PONV Risk Score and Plan: Ondansetron, Dexamethasone, Midazolam and Treatment may vary due to age or medical condition  Airway Management Planned: Oral ETT  Additional Equipment:   Intra-op Plan:   Post-operative Plan: Extubation in OR  Informed Consent: I have reviewed the patients History and Physical, chart, labs and discussed the procedure including the risks, benefits and alternatives for the proposed anesthesia with the patient or authorized representative who has indicated his/her understanding and acceptance.     Dental Advisory Given  Plan Discussed with: Anesthesiologist, CRNA and Surgeon  Anesthesia Plan Comments: (Patient consented for risks of anesthesia including but not limited to:  - adverse reactions to medications - damage to eyes, teeth, lips or other oral mucosa - nerve damage due to positioning  - sore throat or hoarseness - Damage to heart, brain, nerves, lungs, other parts of body or loss of life  Patient voiced understanding.)        Anesthesia Quick Evaluation

## 2020-08-31 ENCOUNTER — Encounter: Payer: Self-pay | Admitting: Internal Medicine

## 2020-08-31 ENCOUNTER — Ambulatory Visit
Admission: RE | Admit: 2020-08-31 | Discharge: 2020-08-31 | Disposition: A | Discharge status: Home / Self Care | Payer: Medicare HMO | Source: Ambulatory Visit | Attending: Internal Medicine | Admitting: Internal Medicine

## 2020-08-31 DIAGNOSIS — R918 Other nonspecific abnormal finding of lung field: Secondary | ICD-10-CM | POA: Diagnosis present

## 2020-08-31 DIAGNOSIS — C3401 Malignant neoplasm of right main bronchus: Secondary | ICD-10-CM | POA: Insufficient documentation

## 2020-08-31 LAB — GLUCOSE, CAPILLARY: Glucose-Capillary: 109 mg/dL — ABNORMAL HIGH (ref 70–99)

## 2020-08-31 MED ORDER — FLUDEOXYGLUCOSE F - 18 (FDG) INJECTION
9.3000 | Freq: Once | INTRAVENOUS | Status: AC | PRN
  Administered 2020-08-31: 9.85 via INTRAVENOUS

## 2020-09-01 ENCOUNTER — Encounter: Payer: Self-pay | Admitting: *Deleted

## 2020-09-01 ENCOUNTER — Encounter: Payer: Self-pay | Admitting: Oncology

## 2020-09-01 ENCOUNTER — Inpatient Hospital Stay: Payer: Medicare HMO | Attending: Oncology | Admitting: Oncology

## 2020-09-01 ENCOUNTER — Inpatient Hospital Stay: Payer: Medicare HMO

## 2020-09-01 VITALS — BP 133/84 | HR 71 | Temp 97.8°F | Ht 70.1 in | Wt 179.0 lb

## 2020-09-01 DIAGNOSIS — Z8249 Family history of ischemic heart disease and other diseases of the circulatory system: Secondary | ICD-10-CM | POA: Diagnosis not present

## 2020-09-01 DIAGNOSIS — Z808 Family history of malignant neoplasm of other organs or systems: Secondary | ICD-10-CM | POA: Diagnosis not present

## 2020-09-01 DIAGNOSIS — Z7189 Other specified counseling: Secondary | ICD-10-CM

## 2020-09-01 DIAGNOSIS — Z87891 Personal history of nicotine dependence: Secondary | ICD-10-CM

## 2020-09-01 DIAGNOSIS — R0789 Other chest pain: Secondary | ICD-10-CM

## 2020-09-01 DIAGNOSIS — C3411 Malignant neoplasm of upper lobe, right bronchus or lung: Secondary | ICD-10-CM

## 2020-09-01 DIAGNOSIS — Z801 Family history of malignant neoplasm of trachea, bronchus and lung: Secondary | ICD-10-CM

## 2020-09-01 DIAGNOSIS — R5383 Other fatigue: Secondary | ICD-10-CM

## 2020-09-01 DIAGNOSIS — Z79899 Other long term (current) drug therapy: Secondary | ICD-10-CM | POA: Diagnosis not present

## 2020-09-01 DIAGNOSIS — R053 Chronic cough: Secondary | ICD-10-CM

## 2020-09-01 DIAGNOSIS — C3491 Malignant neoplasm of unspecified part of right bronchus or lung: Secondary | ICD-10-CM

## 2020-09-01 DIAGNOSIS — Z87442 Personal history of urinary calculi: Secondary | ICD-10-CM | POA: Diagnosis not present

## 2020-09-01 LAB — CYTOLOGY - NON PAP

## 2020-09-01 LAB — SURGICAL PATHOLOGY

## 2020-09-01 NOTE — Progress Notes (Signed)
New pt states he is here to get results. He admits to seeking his care moving forward to [facility].

## 2020-09-01 NOTE — Progress Notes (Signed)
Hematology/Oncology Consult note [facility] Telephone:([phone] [phone]   Patient Care Team: [place], [place] Shirline Frees, MD as PCP - General (Family Medicine) Telford Nab, RN as Oncology Nurse Navigator  REFERRING PROVIDER: Shelly Bombard Carleene Mains*  CHIEF COMPLAINTS/REASON FOR VISIT:  Evaluation of lung cancer  HISTORY OF PRESENTING ILLNESS:   Bradley Bruce is a  65 y.o.  male with PMH listed below was seen in consultation at the request of  Lorenza Cambridge*  for evaluation of lung cancer  Patient has experienced 3 months history of cough and hemoptysis.  He was treated with several rounds of antibiotics and prednisone and cough persisted. 08/17/2020,  CT chest without contrast was obtained which showed right suprahilar mass possibly extending into the right mainstem bronchus and occluding the right upper lobe bronchus consistent with malignancy.  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  Images are not available to me 08/22/2020.  Establish care with pulmonology Dr. Mortimer Fries 08/30/2020, status post bronchoscopy.  Right mainstem bronchus biopsy showed non-small cell carcinoma, favor squamous cell carcinoma.  Right lung mainstem bronchus brushing is positive for non-small cell lung cancer, favor SCC. 08/31/2020, PET scan was obtained and pending to be officially read by radiologist.  Patient was referred to establish care with hematology oncology for evaluation and management. Patient was accompanied by her wife today. Review of system positive for persistent cough and some anterior chest discomfort with deep breathing.  Denies any unintentional weight loss Patient also plans to go to [facility] for an opinion.  Per patient's wife, an MRI brain will be arranged at [place]  Former smoker, quitted in May 2022.  69-pack-year smoking  history  Review of Systems  Constitutional:  Positive for fatigue. Negative for appetite change, chills, fever and unexpected weight change.  HENT:   Negative for hearing loss and voice change.   Eyes:  Negative for eye problems and icterus.  Respiratory:  Negative for chest tightness, cough and shortness of breath.   Cardiovascular:  Negative for chest pain and leg swelling.  Gastrointestinal:  Negative for abdominal distention and abdominal pain.  Endocrine: Negative for hot flashes.  Genitourinary:  Negative for difficulty urinating, dysuria and frequency.   Musculoskeletal:  Negative for arthralgias.  Skin:  Negative for itching and rash.  Neurological:  Negative for light-headedness and numbness.  Hematological:  Negative for adenopathy. Does not bruise/bleed easily.  Psychiatric/Behavioral:  Negative for confusion.    MEDICAL HISTORY:  Past Medical History:  Diagnosis Date   Allergy    Asthma    Cancer ([place]) 08/2020   lung mass probably metastatic   Chronic cough 08/25/2020   COPD (chronic obstructive pulmonary disease) ([place])    Diabetes mellitus without complication ([place])    Dyspnea 08/2020   worsening now   ED (erectile dysfunction)    Fatigue 08/25/2020   Hemoptysis 08/25/2020   still continues for a few days, then stops for a few days and then starts up again   History of kidney stones 1990   passed stone without surgery   Hypertension    Sciatica of right side    Takes gabapentin to help    SURGICAL HISTORY: Past Surgical History:  Procedure Laterality Date   COLONOSCOPY     VIDEO BRONCHOSCOPY WITH ENDOBRONCHIAL ULTRASOUND Right 08/30/2020   Procedure: VIDEO BRONCHOSCOPY WITH ENDOBRONCHIAL ULTRASOUND;  Surgeon: Flora Lipps, MD;  Location: [facility] ORS;  Service: Pulmonary;  Laterality: Right;    SOCIAL HISTORY: Social History   Socioeconomic History   Marital status: Married    Spouse name: Helene Kelp   Number of children: 2   Years of education: Not on file    Highest education level: Not on file  Occupational History   Occupation: Radiation protection practitioner area Copywriter, advertising    Comment: retired  Tobacco Use   Smoking status: Former    Packs/day: 1.50    Years: 46.00    Pack years: 69.00    Types: Cigarettes    Quit date: 07/21/2020    Years since quitting: 0.1   Smokeless tobacco: Never  Vaping Use   Vaping Use: Never used  Substance and Sexual Activity   Alcohol use: Yes    Comment: rare- occasional beer   Drug use: Never   Sexual activity: Yes  Other Topics Concern   Not on file  Social History Narrative   Patient lives with his wife of 53 years.  Patient feels safe in his home.   Has a dog and a cat.   Social Determinants of Health   Financial Resource Strain: Not on file  Food Insecurity: Not on file  Transportation Needs: Not on file  Physical Activity: Not on file  Stress: Not on file  Social Connections: Not on file  Intimate Partner Violence: Not on file    FAMILY HISTORY: Family History  Problem Relation Age of Onset   Cervical cancer Mother    Heart disease Father    Lung cancer Sister     ALLERGIES:  has No Known Allergies.  MEDICATIONS:  Current Outpatient Medications  Medication Sig Dispense Refill   albuterol (VENTOLIN HFA) 108 (90 Base) MCG/ACT inhaler Inhale 2 puffs into the lungs every 6 (six) hours as needed for shortness of breath or wheezing.     APPLE CIDER VINEGAR PO Take 1,200 mg by mouth daily.     benazepril-hydrochlorthiazide (LOTENSIN HCT) 20-25 MG tablet Take 1 tablet by mouth daily.     Camphor-Eucalyptus-Menthol (VICKS VAPORUB EX) Apply 1 application topically at bedtime.     Cholecalciferol (VITAMIN D) 50 MCG (2000 UT) tablet Take 2,000 Units by mouth daily.     fluticasone (FLONASE) 50 MCG/ACT nasal spray Place 2 sprays into both nostrils daily as needed for allergies.     fluticasone-salmeterol (ADVAIR) 250-50 MCG/ACT AEPB Inhale 1 puff into the lungs in the morning and at bedtime.     gabapentin  (NEURONTIN) 300 MG capsule Take 300 mg by mouth at bedtime.     metFORMIN (GLUCOPHAGE-XR) 750 MG 24 hr tablet Take 750 mg by mouth at bedtime.     omega-3 acid ethyl esters (LOVAZA) 1 g capsule Take 1 g by mouth daily.     predniSONE (DELTASONE) 20 MG tablet Take 2 tablets (40 mg total) by mouth daily with breakfast. 14 tablet 0   rosuvastatin (CRESTOR) 20 MG tablet Take 20 mg by mouth daily.     No current facility-administered medications for this visit.     PHYSICAL EXAMINATION: ECOG PERFORMANCE STATUS: 0 - Asymptomatic Vitals:   09/01/20 0933  BP: 133/84  Pulse: 71  Temp: 97.8 F (36.6 C)  SpO2: 99%   Filed Weights   09/01/20 0933  Weight: 179 lb (81.2 kg)    Physical Exam Constitutional:      General: He is not in acute distress. HENT:     Head: Normocephalic and atraumatic.  Eyes:     General: No  scleral icterus. Cardiovascular:     Rate and Rhythm: Normal rate and regular rhythm.     Heart sounds: Normal heart sounds.  Pulmonary:     Effort: Pulmonary effort is normal. No respiratory distress.     Breath sounds: Wheezing present.  Abdominal:     General: Bowel sounds are normal. There is no distension.     Palpations: Abdomen is soft.  Musculoskeletal:        General: No deformity. Normal range of motion.     Cervical back: Normal range of motion and neck supple.  Skin:    General: Skin is warm and dry.     Findings: No erythema or rash.  Neurological:     Mental Status: He is alert and oriented to person, place, and time. Mental status is at baseline.     Cranial Nerves: No cranial nerve deficit.     Coordination: Coordination normal.  Psychiatric:        Mood and Affect: Mood normal.    LABORATORY DATA:  I have reviewed the data as listed Lab Results  Component Value Date   WBC 8.0 08/28/2020   HGB 13.5 08/28/2020   HCT 38.2 (L) 08/28/2020   MCV 85.3 08/28/2020   PLT 283 08/28/2020   Recent Labs    08/28/20 0924  NA 135  K 3.2*  CL 100   CO2 28  GLUCOSE 138*  BUN 18  CREATININE 0.67  CALCIUM 9.2  GFRNONAA >60   Iron/TIBC/Ferritin/ %Sat No results found for: IRON, TIBC, FERRITIN, IRONPCTSAT    RADIOGRAPHIC STUDIES: I have personally reviewed the radiological images as listed and agreed with the findings in the report. DG Outside Films Chest  Result Date: 08/29/2020 This examination belongs to an outside facility and is stored here for comparison purposes only.  Contact the originating outside institution for any associated report or interpretation.     ASSESSMENT & PLAN:  1. Squamous cell carcinoma lung, right ([place])   2. Goals of care, counseling/discussion    #Right squamous cell carcinoma of the lung.  Pathology report was reviewed with patient and wife. CT image findings were reviewed and discussed with patient. PET scan images independently reviewed by me and reviewed with patient. Pending official reading. Clinically stage III lung SCC Recommend brain MRI to complete staging.  Recommend about concurrent chemotherapy- carboplatin AUC 2 and Taxol 45mg /m2 -weekly with radiation.  After concurrent RT and chemotherapy, patient will have about 4 weeks of break and CT chest will be obtained to determine disease status. If no progression of disease, patient will start maintenance immunotherapy with Durvaluamab every 2 weeks for 1 year.   Patient has an appointment with radiation oncology next week. Patient also has an appointment with [facility] for second opinion.  Pending his decision, if he decides to have treatment locally at [facility], we will schedule patient for chemotherapy education. Check NGS    All questions were answered. The patient knows to call the clinic with any problems questions or concerns.  cc Bynum, Milele Likivu Ku*   Thank you for this kind referral and the opportunity to participate in the care of this patient. A copy of today's note is routed to referring provider    Earlie Server, MD,  PhD Hematology Oncology [facility] Pager- [phone] 09/01/2020

## 2020-09-01 NOTE — Progress Notes (Signed)
Met with patient and his wife during initial consult with Dr. Tasia Catchings. All questions answered during visit. Pathology and PET scan results are still pending at the time of visit. Pt stated that is seeking second opinion at [facility] next week. Contact info given and informed to contact if wishes to resume treatment locally. Pt and his wife verbalized understanding. Nothing further needed at this time.

## 2020-09-05 ENCOUNTER — Ambulatory Visit: Payer: Medicare HMO | Admitting: Radiation Oncology

## 2020-09-08 ENCOUNTER — Encounter: Payer: Self-pay | Admitting: *Deleted

## 2020-09-08 NOTE — Progress Notes (Signed)
Pt has cancelled appts at [facility] since will be seeking cancer care at [facility]. Pt has established care with Dr. Merleen Nicely and Dr. Loletta Specter to start lung cancer treatment. Pt has contact info for any future questions or needs. Nothing further needed at this time.

## 2020-09-11 ENCOUNTER — Institutional Professional Consult (permissible substitution): Payer: BC Managed Care – PPO | Admitting: Pulmonary Disease

## 2020-09-14 DIAGNOSIS — R918 Other nonspecific abnormal finding of lung field: Secondary | ICD-10-CM

## 2020-09-14 DIAGNOSIS — C3491 Malignant neoplasm of unspecified part of right bronchus or lung: Secondary | ICD-10-CM

## 2020-09-26 ENCOUNTER — Ambulatory Visit: Payer: Medicare HMO | Admitting: Adult Health

## 2021-04-17 ENCOUNTER — Telehealth: Payer: Self-pay | Admitting: Adult Health

## 2021-04-17 ENCOUNTER — Encounter: Payer: Self-pay | Admitting: Adult Health

## 2021-04-17 ENCOUNTER — Other Ambulatory Visit: Payer: Self-pay

## 2021-04-17 ENCOUNTER — Ambulatory Visit: Payer: Medicare Other | Admitting: Adult Health

## 2021-04-17 VITALS — BP 120/78 | HR 85 | Temp 98.3°F | Ht 70.0 in | Wt 182.0 lb

## 2021-04-17 DIAGNOSIS — C3491 Malignant neoplasm of unspecified part of right bronchus or lung: Secondary | ICD-10-CM

## 2021-04-17 DIAGNOSIS — R918 Other nonspecific abnormal finding of lung field: Secondary | ICD-10-CM

## 2021-04-17 DIAGNOSIS — R053 Chronic cough: Secondary | ICD-10-CM

## 2021-04-17 DIAGNOSIS — J449 Chronic obstructive pulmonary disease, unspecified: Secondary | ICD-10-CM

## 2021-04-17 MED ORDER — FLUTICASONE-SALMETEROL 250-50 MCG/ACT IN AEPB: 1.0000 | INHALATION_SPRAY | Freq: Two times a day (BID) | RESPIRATORY_TRACT | 5 refills | Status: DC

## 2021-04-17 MED ORDER — FLUTICASONE-SALMETEROL 250-50 MCG/ACT IN AEPB: 1.0000 | INHALATION_SPRAY | Freq: Two times a day (BID) | RESPIRATORY_TRACT | 3 refills | Status: DC

## 2021-04-17 NOTE — Progress Notes (Signed)
@Patient  ID: Bradley Bruce, male    DOB: 03/13/1955, 66 y.o.   MRN: [id number]  Chief Complaint  Patient presents with   Follow-up    Referring provider: Lorenza Cambridge*  HPI: 66 year old male former smoker followed for non-small cell lung cancer, favored squamous cell carcinoma, stage IIIa, COPD  TEST/EVENTS :  05/31/20 Developed a cough and was treated with antibiotics and steroids for presumed bronchitis/ pneumonia - 08/07/20 Cough persisted after treatment and developed associated hemoptysis and fatigue - 08/17/20: CT Chest showing 4.0x3.6x4.3cm right suprahilar/hilar mass possibly extending into the right mainstem bronchus, irregular nodular and groundglass opacities in RUL suspected to represent postobstructive infection versus lymphangitic carcinomatosis - 08/30/20: EBUS with biopsy at [facility] with path pos NSCLC, favored to represent squamous cell carcinoma - 08/31/2020: PET CT at [facility] showing 4.4 cm hypermetabolic mass in the right hilum with mediastinal involvement in the right paratracheal space, consistent with primary bronchogenic carcinoma; and 1.8 cm hypermetabolic lymph node in right infrahilar region, consistent with metastatic disease.  - 09/01/2020: Patient had initial outpatient oncology visit at [facility] with recommendation of concurrent chemotherapy- carboplatin AUC 2 and Taxol 45mg /m2 -weekly with radiation; but patient with plan for second opinion at [facility] - 09/05/2020: Brain MRI completed with no evidence of intracranial metastatic disease. Patient with initial visit with [facility] with recommendation for chemoradiation; case also reviewed by surgery on this date and determined not to be a surgical candidate - 09/06/2020: Patient with initial visit with medical oncology at [place] with Dr. Loletta Specter - 7/18- 10/27/20 completed definitive daily RT. Concurrent weekly chemo Norma Fredrickson / pacli  - 11/27/20 CT  chest decrease in right perihilar mass w new RUL GGO favored to be radiation change.  - 12/25/20 post COVID. - 01/01/21 MRI brain NED - 02/28/21: CT Chest: increased R paratracheal opacity likely post-radiation; underlying obstruction of the bronchus may represent postobstructive pneumonitis - 03/28/21 C5 durvalumab  04/17/2021 Follow up : COPD , Lung cancer  Patient presents for 36-month follow-up.  Patient has underlying COPD, no PFT on record .  Patient remains on Advair twice daily.  Patient says his insurance is no longer going to cover Advair and needs to be changed to [employer] .  Patient says overall breathing is doing okay.  Since having radiation and undergoing chemo he says that he gets tired easier.  He is remaining active.  Goes to the [facility] several times a week.  Also stays active at home.  He says he does get short of breath with heavy activity such as going up an incline or stairs.  Patient says he has a daily cough he says cough can be quite severe at times.  He denies any fever, hemoptysis or discolored mucus.  Patient has tried Mucinex without much relief of cough.  Patient is on ACE inhibitor.  Has some sinus drainage.  No significant reflux.  Is on Prilosec daily.  But taken at nighttime.  No albuterol use. Flu and COVID vaccines are up-to-date.  Patient is scheduled for Pneumovax next week.  As above patient was diagnosed with lung cancer June 2022.  Patient is managed by [facility] oncology.  Patient has completed concurrent chemotherapy with carboplatin and Taxol.  He completed radiation early August 2022.  MRI brain was negative for metastasis.  Most recent CT chest February 28, 2021 showed increased right peritracheal opacity likely postradiation, underlying obstruction of the bronchus may be representative of a postobstructive pneumonitis.  Patient is currently on [place].  Patient says this is more tolerable.  He has fatigue and low energy but symptoms are manageable.  He denies any  unintentional weight loss.  Hemoptysis.  No Known Allergies  Immunization History  Administered Date(s) Administered   Fluad Quad(high Dose 65+) 01/05/2021   Influenza Split 02/06/2005   Influenza,inj,Quad PF,6+ Mos 11/21/2015, 11/21/2016, 12/02/2017   Influenza-Unspecified 11/26/2012, 12/19/2014   PFIZER(Purple Top)SARS-COV-2 Vaccination 05/12/2019, 06/06/2019   Pneumococcal Polysaccharide-23 09/01/2014   Tdap 08/04/2004, 01/04/2011   Zoster Recombinat (Shingrix) 11/21/2016, 12/24/2017   Zoster, Live 12/28/2014    Past Medical History:  Diagnosis Date   Allergy    Asthma    Cancer ([place]) 08/2020   lung mass probably metastatic   Chronic cough 08/25/2020   COPD (chronic obstructive pulmonary disease) ([place])    Diabetes mellitus without complication ([place])    Dyspnea 08/2020   worsening now   ED (erectile dysfunction)    Fatigue 08/25/2020   Hemoptysis 08/25/2020   still continues for a few days, then stops for a few days and then starts up again   History of kidney stones 1990   passed stone without surgery   Hypertension    Sciatica of right side    Takes gabapentin to help    Tobacco History: Social History   Tobacco Use  Smoking Status Former   Packs/day: 1.50   Years: 46.00   Pack years: 69.00   Types: Cigarettes   Quit date: 07/21/2020   Years since quitting: 0.7  Smokeless Tobacco Never   Counseling given: Not Answered   Outpatient Medications Prior to Visit  Medication Sig Dispense Refill   albuterol (VENTOLIN HFA) 108 (90 Base) MCG/ACT inhaler Inhale 2 puffs into the lungs every 6 (six) hours as needed for shortness of breath or wheezing.     benazepril-hydrochlorthiazide (LOTENSIN HCT) 20-25 MG tablet Take 1 tablet by mouth daily.     Camphor-Eucalyptus-Menthol (VICKS VAPORUB EX) Apply 1 application topically at bedtime.     durvalumab 10 mg/kg in sodium chloride 0.9 % 100 mL Inject 10 mg/kg into the vein once.     gabapentin (NEURONTIN) 300 MG  capsule Take 300 mg by mouth at bedtime.     KLOR-CON M20 20 MEQ tablet Take 20 mEq by mouth daily.     metFORMIN (GLUCOPHAGE-XR) 750 MG 24 hr tablet Take 750 mg by mouth at bedtime.     omeprazole (PRILOSEC) 40 MG capsule Take 40 mg by mouth daily.     rosuvastatin (CRESTOR) 20 MG tablet Take 20 mg by mouth daily.     fluticasone-salmeterol (ADVAIR) 250-50 MCG/ACT AEPB Inhale 1 puff into the lungs in the morning and at bedtime.     APPLE CIDER VINEGAR PO Take 1,200 mg by mouth daily.     Cholecalciferol (VITAMIN D) 50 MCG (2000 UT) tablet Take 2,000 Units by mouth daily.     fluticasone (FLONASE) 50 MCG/ACT nasal spray Place 2 sprays into both nostrils daily as needed for allergies.     omega-3 acid ethyl esters (LOVAZA) 1 g capsule Take 1 g by mouth daily.     predniSONE (DELTASONE) 20 MG tablet Take 2 tablets (40 mg total) by mouth daily with breakfast. 14 tablet 0   No facility-administered medications prior to visit.     Review of Systems:   Constitutional:   No  weight loss, night sweats,  Fevers, chills,  +fatigue, or  lassitude.  HEENT:   No headaches,  Difficulty  swallowing,  Tooth/dental problems, or  Sore throat,                No sneezing, itching, ear ache,  +nasal congestion, post nasal drip,   CV:  No chest pain,  Orthopnea, PND, swelling in lower extremities, anasarca, dizziness, palpitations, syncope.   GI  No heartburn, indigestion, abdominal pain, nausea, vomiting, diarrhea, change in bowel habits, loss of appetite, bloody stools.   Resp:   No chest wall deformity  Skin: no rash or lesions.  GU: no dysuria, change in color of urine, no urgency or frequency.  No flank pain, no hematuria   MS:  No joint pain or swelling.  No decreased range of motion.  No back pain.    Physical Exam  BP 120/78 (BP Location: Left Arm, Cuff Size: Normal)    Pulse 85    Temp 98.3 F (36.8 C) (Temporal)    Ht 5\' 10"  (1.778 m)    Wt 182 lb (82.6 kg)    SpO2 96%    BMI 26.11 kg/m    GEN: A/Ox3; pleasant , NAD, well nourished    HEENT:  [place]/AT,  NOSE-clear, THROAT-clear, no lesions, no postnasal drip or exudate noted.   NECK:  Supple w/ fair ROM; no JVD; normal carotid impulses w/o bruits; no thyromegaly or nodules palpated; no lymphadenopathy.    RESP  Clear  P & A; w/o, wheezes/ rales/ or rhonchi. no accessory muscle use, no dullness to percussion  CARD:  RRR, no m/r/g, no peripheral edema, pulses intact, no cyanosis or clubbing.  GI:   Soft & nt; nml bowel sounds; no organomegaly or masses detected.   Musco: Warm bil, no deformities or joint swelling noted.   Neuro: alert, no focal deficits noted.    Skin: Warm, no lesions or rashes    Lab Results:  CBC   BNP No results found for: BNP  ProBNP No results found for: PROBNP  Imaging: No results found.    No flowsheet data found.  No results found for: NITRICOXIDE      Assessment & Plan:   COPD (chronic obstructive pulmonary disease) ([place]) Appears to be compensated on Advair.  Will need to change over to generic Wixela per insurance requirements. Patient has no PFTs on record.  We will check PFTs on return in 3 months  Plan  Patient Instructions  May change Advair to Wixela 250 1 puff Twice daily , rinse after use.  Albuterol inhaler 1-2 puffs every 6hr as needed  Activity as tolerated.  Delsym 2 tsp Twice daily  As needed for cough  Zyrtec 10mg  At bedtime  As needed  drainage  Take Prilosec 40mg  daily in am  Add Pepcid 20mg  At bedtime   Discuss with Primary Provider that Benazepril may be aggravating your cough.  Follow up with Dr. Mortimer Fries in 3 months with PFT and As needed   Please contact office for sooner follow up if symptoms do not improve or worsen or seek emergency care         Squamous cell carcinoma lung, right [facility]) Continue with ongoing treatment plan with [facility] oncology.  Care everywhere notes were reviewed.  Chronic cough Patient has a daily ongoing cough.   Suspect is multifactorial. Patient is on ACE inhibitor.  Would recommend changing ACE inhibitor if possible as it may be aggravating his underlying cough.  Will check PFTs on return.  Treat for trigger prevention with GERD and chronic rhinitis regimen. CT chest with radiation  change ? Pneumonitis . If cough not improved, ? Consider steroids . Has CT chest planned next month .   Plan  Patient Instructions  May change Advair to Wixela 250 1 puff Twice daily , rinse after use.  Albuterol inhaler 1-2 puffs every 6hr as needed  Activity as tolerated.  Delsym 2 tsp Twice daily  As needed for cough  Zyrtec 10mg  At bedtime  As needed  drainage  Take Prilosec 40mg  daily in am  Add Pepcid 20mg  At bedtime   Discuss with Primary Provider that Benazepril may be aggravating your cough.  Follow up with Dr. Mortimer Fries in 3 months with PFT and As needed   Please contact office for sooner follow up if symptoms do not improve or worsen or seek emergency care           Rexene Edison, NP 04/17/2021

## 2021-04-17 NOTE — Assessment & Plan Note (Signed)
Patient has a daily ongoing cough.  Suspect is multifactorial. Patient is on ACE inhibitor.  Would recommend changing ACE inhibitor if possible as it may be aggravating his underlying cough.  Will check PFTs on return.  Treat for trigger prevention with GERD and chronic rhinitis regimen. CT chest with radiation change ? Pneumonitis . If cough not improved, ? Consider steroids . Has CT chest planned next month .   Plan  Patient Instructions  May change Advair to Wixela 250 1 puff Twice daily , rinse after use.  Albuterol inhaler 1-2 puffs every 6hr as needed  Activity as tolerated.  Delsym 2 tsp Twice daily  As needed for cough  Zyrtec 10mg  At bedtime  As needed  drainage  Take Prilosec 40mg  daily in am  Add Pepcid 20mg  At bedtime   Discuss with Primary Provider that Benazepril may be aggravating your cough.  Follow up with Dr. Mortimer Fries in 3 months with PFT and As needed   Please contact office for sooner follow up if symptoms do not improve or worsen or seek emergency care

## 2021-04-17 NOTE — Telephone Encounter (Signed)
Per Rexene Edison, NP verbally-okay to switch back to Advair 250. Rx sent to preferred pharmacy.  Patient is aware and voiced his understanding.  Nothing further needed.

## 2021-04-17 NOTE — Assessment & Plan Note (Signed)
Continue with ongoing treatment plan with [facility].  Care everywhere notes were reviewed.

## 2021-04-17 NOTE — Patient Instructions (Signed)
May change Advair to Wixela 250 1 puff Twice daily , rinse after use.  Albuterol inhaler 1-2 puffs every 6hr as needed  Activity as tolerated.  Delsym 2 tsp Twice daily  As needed for cough  Zyrtec 10mg  At bedtime  As needed  drainage  Take Prilosec 40mg  daily in am  Add Pepcid 20mg  At bedtime   Discuss with Primary Provider that Benazepril may be aggravating your cough.  Follow up with Dr. Mortimer Fries in 3 months with PFT and As needed   Please contact office for sooner follow up if symptoms do not improve or worsen or seek emergency care

## 2021-04-17 NOTE — Assessment & Plan Note (Signed)
Appears to be compensated on Advair.  Will need to change over to generic Wixela per insurance requirements. Patient has no PFTs on record.  We will check PFTs on return in 3 months  Plan  Patient Instructions  May change Advair to Wixela 250 1 puff Twice daily , rinse after use.  Albuterol inhaler 1-2 puffs every 6hr as needed  Activity as tolerated.  Delsym 2 tsp Twice daily  As needed for cough  Zyrtec 10mg  At bedtime  As needed  drainage  Take Prilosec 40mg  daily in am  Add Pepcid 20mg  At bedtime   Discuss with Primary Provider that Benazepril may be aggravating your cough.  Follow up with Dr. Mortimer Fries in 3 months with PFT and As needed   Please contact office for sooner follow up if symptoms do not improve or worsen or seek emergency care

## 2021-07-25 ENCOUNTER — Encounter: Payer: Self-pay | Admitting: Medical Oncology

## 2021-07-25 ENCOUNTER — Emergency Department
Admission: EM | Admit: 2021-07-25 | Discharge: 2021-07-25 | Disposition: A | Discharge status: Home / Self Care | Payer: Medicare Other | Attending: Emergency Medicine | Admitting: Emergency Medicine

## 2021-07-25 ENCOUNTER — Emergency Department: Payer: Medicare Other

## 2021-07-25 DIAGNOSIS — J40 Bronchitis, not specified as acute or chronic: Secondary | ICD-10-CM | POA: Insufficient documentation

## 2021-07-25 DIAGNOSIS — J449 Chronic obstructive pulmonary disease, unspecified: Secondary | ICD-10-CM | POA: Insufficient documentation

## 2021-07-25 DIAGNOSIS — R0602 Shortness of breath: Secondary | ICD-10-CM | POA: Diagnosis present

## 2021-07-25 DIAGNOSIS — I10 Essential (primary) hypertension: Secondary | ICD-10-CM | POA: Diagnosis not present

## 2021-07-25 DIAGNOSIS — R042 Hemoptysis: Secondary | ICD-10-CM | POA: Diagnosis not present

## 2021-07-25 DIAGNOSIS — D0221 Carcinoma in situ of right bronchus and lung: Secondary | ICD-10-CM | POA: Insufficient documentation

## 2021-07-25 DIAGNOSIS — R918 Other nonspecific abnormal finding of lung field: Secondary | ICD-10-CM | POA: Diagnosis not present

## 2021-07-25 LAB — CBC
HCT: 39.6 % (ref 39.0–52.0)
Hemoglobin: 13.4 g/dL (ref 13.0–17.0)
MCH: 28.2 pg (ref 26.0–34.0)
MCHC: 33.8 g/dL (ref 30.0–36.0)
MCV: 83.4 fL (ref 80.0–100.0)
Platelets: 296 10*3/uL (ref 150–400)
RBC: 4.75 MIL/uL (ref 4.22–5.81)
RDW: 13 % (ref 11.5–15.5)
WBC: 8.8 10*3/uL (ref 4.0–10.5)
nRBC: 0 % (ref 0.0–0.2)

## 2021-07-25 LAB — BASIC METABOLIC PANEL
Anion gap: 10 (ref 5–15)
BUN: 15 mg/dL (ref 8–23)
CO2: 25 mmol/L (ref 22–32)
Calcium: 9.3 mg/dL (ref 8.9–10.3)
Chloride: 96 mmol/L — ABNORMAL LOW (ref 98–111)
Creatinine, Ser: 0.76 mg/dL (ref 0.61–1.24)
GFR, Estimated: >60 mL/min (ref >60)
Glucose, Bld: 204 mg/dL — ABNORMAL HIGH (ref 70–99)
Potassium: 3.5 mmol/L (ref 3.5–5.1)
Sodium: 131 mmol/L — ABNORMAL LOW (ref 135–145)

## 2021-07-25 LAB — TROPONIN I (HIGH SENSITIVITY)
Troponin I (High Sensitivity): 3 ng/L (ref <18)
Troponin I (High Sensitivity): 4 ng/L (ref <18)

## 2021-07-25 MED ORDER — IOHEXOL 350 MG/ML SOLN
75.0000 mL | Freq: Once | INTRAVENOUS | Status: AC | PRN
  Administered 2021-07-25: 75 mL via INTRAVENOUS

## 2021-07-25 MED ORDER — GUAIFENESIN ER 600 MG PO TB12: 600.0000 mg | ORAL_TABLET | Freq: Two times a day (BID) | ORAL | 0 refills | Status: AC

## 2021-07-25 MED ORDER — DOXYCYCLINE HYCLATE 100 MG PO CAPS: 100.0000 mg | ORAL_CAPSULE | Freq: Two times a day (BID) | ORAL | 0 refills | Status: AC

## 2021-07-25 MED ORDER — IPRATROPIUM-ALBUTEROL 0.5-2.5 (3) MG/3ML IN SOLN
3.0000 mL | Freq: Once | RESPIRATORY_TRACT | Status: AC
Start: 2021-07-25 — End: 2021-07-25
  Administered 2021-07-25: 3 mL via RESPIRATORY_TRACT
  Filled 2021-07-25: qty 3

## 2021-07-25 NOTE — ED Provider Triage Note (Signed)
Emergency Medicine Provider Triage Evaluation Note  Bradley Bruce , a 66 y.o. male  was evaluated in triage.  Patient has a history of squamous cell carcinoma of the right lung, diabetes, hypertension and chronic cough presenting to the emergency department with worsening shortness of breath and hemoptysis as well as right-sided chest pain.  He states the symptoms are new for him.  No fever or chills at home.  Review of Systems  Positive: Patient has hemoptysis and shortness of breath. Negative: No nausea, vomiting or abdominal pain.  Physical Exam  There were no vitals taken for this visit. Gen:   Awake, no distress   Resp:  Normal effort  MSK:   Moves extremities without difficulty    Medical Decision Making  Medically screening exam initiated at 5:37 PM.  Appropriate orders placed.  Bradley Bruce was informed that the remainder of the evaluation will be completed by another provider, this initial triage assessment does not replace that evaluation, and the importance of remaining in the ED until their evaluation is complete.     Bradley Bruce, [place] 07/25/21 1739

## 2021-07-25 NOTE — Discharge Instructions (Addendum)
Your CT scan does not show any signs of blood clots in the lungs.  Symptoms appear to be due to bronchitis, possibly worsened by the lung mass pressing on your airway in that region of the lung.  Use guaifenesin and menthol lozenges to help with respiratory symptoms.  Take doxycycline to protect against pneumonia.

## 2021-07-25 NOTE — ED Provider Notes (Signed)
[facility] Provider Note    Event Date/Time   First MD Initiated Contact with Patient 07/25/21 1855     (approximate)   History   Chief Complaint: Chest Pain and Hemoptysis   HPI  Bradley Bruce is a 66 y.o. male with a history of COPD, hypertension, squamous cell carcinoma of the right lung with endobronchial mass undergoing treatment at [facility] who comes ED complaining of 2 days of chest pain which hurts to breathe, associated with shortness of breath and somewhat hoarse voice.  He has frequent coughing which is productive of sputum and at times hemoptysis.  Denies fever or chills.  No exertional symptoms.  No palpitations or dizziness or syncope.     Physical Exam   Triage Vital Signs: ED Triage Vitals  Enc Vitals Group     BP 07/25/21 1739 110/81     Pulse Rate 07/25/21 1739 (!) 113     Resp 07/25/21 1739 18     Temp 07/25/21 1739 98.7 F (37.1 C)     Temp Source 07/25/21 1739 Oral     SpO2 07/25/21 1739 95 %     Weight 07/25/21 1740 178 lb (80.7 kg)     Height 07/25/21 1740 5\' 10"  (1.778 m)     Head Circumference --      Peak Flow --      Pain Score 07/25/21 1740 0     Pain Loc --      Pain Edu? --      Excl. in [place]? --     Most recent vital signs: Vitals:   07/25/21 1840 07/25/21 1900  BP: 103/72 98/77  Pulse: (!) 105 96  Resp: 14 17  Temp:    SpO2: 97% 98%    General: Awake, no distress.  CV:  Good peripheral perfusion.  Tachycardia heart rate 110. Resp:  Normal effort.  Good air entry in all lung fields.  Slight expiratory wheezing which is symmetric.  No focal crackles. Abd:  No distention.  Soft nontender Other:  No lower extremity edema or calf tenderness.   ED Results / Procedures / Treatments   Labs (all labs ordered are listed, but only abnormal results are displayed) Labs Reviewed  BASIC METABOLIC PANEL - Abnormal; Notable for the following components:      Result Value   Sodium 131 (*)    Chloride 96 (*)    Glucose,  Bld 204 (*)    All other components within normal limits  CBC  TROPONIN I (HIGH SENSITIVITY)  TROPONIN I (HIGH SENSITIVITY)     EKG Interpreted by me Sinus tachycardia rate 114.  Normal axis intervals QRS ST segments and T waves.   RADIOLOGY Chest x-ray viewed and interpreted by me, appears normal.  Radiology report reviewed.  CT angio chest report reviewed, noting right bronchial mass with some obstructive physiology.  No PE.  No consolidation.   PROCEDURES:  Procedures   MEDICATIONS ORDERED IN ED: Medications  ipratropium-albuterol (DUONEB) 0.5-2.5 (3) MG/3ML nebulizer solution 3 mL (3 mLs Nebulization Given 07/25/21 1917)  iohexol (OMNIPAQUE) 350 MG/ML injection 75 mL (75 mLs Intravenous Contrast Given 07/25/21 1928)     IMPRESSION / MDM / ASSESSMENT AND PLAN / ED COURSE  I reviewed the triage vital signs and the nursing notes.                              Differential diagnosis includes, but  is not limited to, bronchitis, pneumonia, pulmonary edema, pleural effusion, pulmonary embolism  Patient's presentation is most consistent with acute presentation with potential threat to life or bodily function.  Patient presents with pleuritic chest pain, tachycardia, hemoptysis.  He has a history of lung cancer.  High risk for PE so CT angiogram was obtained which is negative for PE.  It does demonstrate that he has some degree of bronchial obstruction from his lung cancer which is likely contributing to bronchitis symptoms.  I will have him take guaifenesin, menthol lozenges, doxycycline, continue follow-up with his doctors.  He does not require admission due to overall reassuring work-up and normal oxygenation.Marland Kitchen       FINAL CLINICAL IMPRESSION(S) / ED DIAGNOSES   Final diagnoses:  Bronchitis  Lung mass     Rx / DC Orders   ED Discharge Orders          Ordered    guaiFENesin (MUCINEX) 600 MG 12 hr tablet  2 times daily        07/25/21 2014    doxycycline  (VIBRAMYCIN) 100 MG capsule  2 times daily        07/25/21 2014             Note:  This document was prepared using Dragon voice recognition software and may include unintentional dictation errors.   Carrie Mew, MD 07/25/21 2019

## 2021-07-25 NOTE — ED Notes (Signed)
Pt return from CT, placed back on cardic monitor, wife at bedside, both express no needs

## 2021-07-25 NOTE — ED Triage Notes (Addendum)
Pt reports that he has been having 2 days of chest pain, sob and coughing up a small amount of blood, pt also reports hoarse voice that is new. Pt denies fever. States that he was last treated for Stage 3 Squamous Cell Lung CA. Receiving immunotherapy right now- Durvalumab

## 2021-07-25 NOTE — ED Notes (Signed)
Pt is lung cancer pt. Had last immunotherapy tx treatment last week without issues. Had small pneumo recently and was treated for it but cough came back worse and had some blood in it the past 2 days. States mucus with cough and tightness across chest. Has PET scan scheduled for this Friday.

## 2021-07-25 NOTE — ED Notes (Signed)
Pt to CT at this time.

## 2021-07-31 ENCOUNTER — Ambulatory Visit: Payer: Medicare Other | Admitting: Internal Medicine

## 2021-08-16 ENCOUNTER — Encounter: Payer: Self-pay | Admitting: Physician Assistant

## 2021-08-16 ENCOUNTER — Ambulatory Visit (INDEPENDENT_AMBULATORY_CARE_PROVIDER_SITE_OTHER): Payer: Medicare Other | Admitting: Physician Assistant

## 2021-08-16 VITALS — BP 116/84 | HR 118 | Temp 97.8°F | Ht 69.8 in | Wt 173.2 lb

## 2021-08-16 DIAGNOSIS — I10 Essential (primary) hypertension: Secondary | ICD-10-CM | POA: Diagnosis not present

## 2021-08-16 DIAGNOSIS — J449 Chronic obstructive pulmonary disease, unspecified: Secondary | ICD-10-CM

## 2021-08-16 DIAGNOSIS — E785 Hyperlipidemia, unspecified: Secondary | ICD-10-CM

## 2021-08-16 MED ORDER — BENAZEPRIL-HYDROCHLOROTHIAZIDE 20-25 MG PO TABS: 1.0000 | ORAL_TABLET | Freq: Every day | ORAL | 2 refills | Status: DC

## 2021-08-16 NOTE — Assessment & Plan Note (Addendum)
Chronic, ongoing Currently managed with Spiriva, Albuterol and Advair - appears partially controlled at this time.  Likely exacerbated by lung cancer Dx He is followed by pulmonology and appears to be managed by them for both dx Discussed filling out handicap placard due to difficulty breathing  Continue with current medications  Follow up as needed

## 2021-08-16 NOTE — Progress Notes (Signed)
New Patient Office Visit  Subjective    Patient ID: Bradley Bruce, male    DOB: 1955/09/04  Age: 66 y.o. MRN: [id number]  Today's Provider: Talitha Givens, MHS, PA-C Introduced myself to the patient as a PA-C and provided education on APPs in clinical practice.   CC:  Chief Complaint  Patient presents with   New Patient (Initial Visit)    Patient wants a handicap parking placard signed.    HPI Bradley Bruce presents to establish care He is requesting a handicap parking placard.   HYPERTENSION / HYPERLIPIDEMIA Satisfied with current treatment? yes HTN medications: Lotensin 20-25mg ,  Duration of hypertension: chronic BP monitoring frequency: daily BP range: 117/80s  BP medication side effects: yes Duration of hyperlipidemia: chronic Cholesterol medication side effects: no Cholesterol supplements: none HLD medications: Rosuvastatin 20 mg PO QD Medication compliance: excellent compliance Aspirin: no Recent stressors: yes Recurrent headaches: no Visual changes: no Palpitations: no Dyspnea: yes Chest pain: yes Lower extremity edema: no Dizzy/lightheaded: no   SHORTNESS OF BREATH- related to ongoing lung cancer  Duration: 4-6 months  Onset: gradual Description of breathing discomfort:  Severity: severe Related to exertion: yes Cough: no Chest tightness: no Wheezing: yes Fevers: no Chest pain: yes, along right side  Palpitations: no  Nausea: yes Diaphoresis: no Deconditioning: yes Status: fluctuating,  Aggravating factors: exertion, going up stairs.  Alleviating factors: rest Treatments attempted: He is taking Spiriva, albuterol, and Advair - states he doesn't notice much of a difference but Albuterol seems to improve chest tightness and pain  He is going to [facility] for lung cancer management  Reports he is having difficulty speaking due to cancer spread Reports right-sided chest pain and back pain that is relieved with tylenol   He sees [facility] for derm  concerns   Outpatient Encounter Medications as of 08/16/2021  Medication Sig   albuterol (VENTOLIN HFA) 108 (90 Base) MCG/ACT inhaler Inhale 2 puffs into the lungs every 6 (six) hours as needed for shortness of breath or wheezing.   Camphor-Eucalyptus-Menthol (VICKS VAPORUB EX) Apply 1 application topically at bedtime.   fluticasone-salmeterol (ADVAIR DISKUS) 250-50 MCG/ACT AEPB Inhale 1 puff into the lungs in the morning and at bedtime.   KLOR-CON M20 20 MEQ tablet Take 20 mEq by mouth daily.   metFORMIN (GLUCOPHAGE-XR) 750 MG 24 hr tablet Take 750 mg by mouth at bedtime.   omeprazole (PRILOSEC) 40 MG capsule Take 40 mg by mouth daily.   rosuvastatin (CRESTOR) 20 MG tablet Take 20 mg by mouth daily.   [DISCONTINUED] benazepril-hydrochlorthiazide (LOTENSIN HCT) 20-25 MG tablet Take 1 tablet by mouth daily.   benazepril-hydrochlorthiazide (LOTENSIN HCT) 20-25 MG tablet Take 1 tablet by mouth daily.   cetirizine (ZYRTEC) 10 MG tablet Take by mouth.   durvalumab 10 mg/kg in sodium chloride 0.9 % 100 mL Inject 10 mg/kg into the vein once.   Potassium Chloride ER 20 MEQ TBCR Take 1 tablet by mouth daily.   prochlorperazine (COMPAZINE) 10 MG tablet Take 10 mg by mouth every 6 (six) hours as needed.   SPIRIVA RESPIMAT 2.5 MCG/ACT AERS SMARTSIG:2 Puff(s) Via Inhaler Daily   [DISCONTINUED] chlorpheniramine-HYDROcodone 10-8 MG/5ML SMARTSIG:5 Milliliter(s) By Mouth Every 12 Hours   [DISCONTINUED] gabapentin (NEURONTIN) 300 MG capsule Take 300 mg by mouth at bedtime.   No facility-administered encounter medications on file as of 08/16/2021.    Past Medical History:  Diagnosis Date   Allergy    Asthma    Cancer ([place]) 08/2020   lung mass  probably metastatic   Chronic cough 08/25/2020   COPD (chronic obstructive pulmonary disease) (HCC)    Diabetes mellitus without complication ([place])    Dyspnea 08/2020   worsening now   ED (erectile dysfunction)    Fatigue 08/25/2020   Hemoptysis 08/25/2020    still continues for a few days, then stops for a few days and then starts up again   History of kidney stones 1990   passed stone without surgery   Hypertension    Sciatica of right side    Takes gabapentin to help    Past Surgical History:  Procedure Laterality Date   COLONOSCOPY     VIDEO BRONCHOSCOPY WITH ENDOBRONCHIAL ULTRASOUND Right 08/30/2020   Procedure: VIDEO BRONCHOSCOPY WITH ENDOBRONCHIAL ULTRASOUND;  Surgeon: Flora Lipps, MD;  Location: [facility] ORS;  Service: Pulmonary;  Laterality: Right;    Family History  Problem Relation Age of Onset   Cervical cancer Mother    Heart disease Father    Lung cancer Sister     Social History   Socioeconomic History   Marital status: Married    Spouse name: Helene Kelp   Number of children: 2   Years of education: Not on file   Highest education level: Not on file  Occupational History   Occupation: Radiation protection practitioner area Copywriter, advertising    Comment: retired  Tobacco Use   Smoking status: Former    Packs/day: 1.50    Years: 46.00    Total pack years: 69.00    Types: Cigarettes    Quit date: 07/21/2020    Years since quitting: 1.0    Passive exposure: Past   Smokeless tobacco: Never  Vaping Use   Vaping Use: Never used  Substance and Sexual Activity   Alcohol use: Not Currently    Comment: rare- occasional beer   Drug use: Never   Sexual activity: Yes  Other Topics Concern   Not on file  Social History Narrative   Patient lives with his wife of 78 years.  Patient feels safe in his home.   Has a dog and a cat.   Social Determinants of Health   Financial Resource Strain: Not on file  Food Insecurity: Not on file  Transportation Needs: Not on file  Physical Activity: Not on file  Stress: Not on file  Social Connections: Not on file  Intimate Partner Violence: Not on file    Review of Systems  Constitutional:  Positive for malaise/fatigue. Negative for chills, diaphoresis and fever.  Eyes:  Negative for blurred vision, double  vision and photophobia.  Respiratory:  Positive for shortness of breath, wheezing and stridor.   Cardiovascular:  Positive for chest pain. Negative for palpitations and leg swelling.  Gastrointestinal:  Positive for heartburn and nausea. Negative for constipation, diarrhea and vomiting.  Musculoskeletal:  Positive for back pain. Negative for falls.  Skin:  Negative for itching and rash.  Neurological:  Negative for dizziness and headaches.  Psychiatric/Behavioral:  Negative for depression and memory loss. The patient is not nervous/anxious and does not have insomnia.         Objective    BP 116/84   Pulse (!) 118   Temp 97.8 F (36.6 C) (Oral)   Ht 5' 9.8" (1.773 m)   Wt 173 lb 3.2 oz (78.6 kg)   SpO2 93%   BMI 24.99 kg/m   Physical Exam Vitals reviewed.  Constitutional:      Appearance: Normal appearance. He is normal weight.  HENT:  Head: Normocephalic and atraumatic.  Eyes:     General: Lids are normal.     Pupils: Pupils are equal, round, and reactive to light.  Neck:     Thyroid: No thyroid mass, thyromegaly or thyroid tenderness.     Comments: Mild rigidity noted on right side of neck Phonation is overall hoarse and seems forced  Cardiovascular:     Rate and Rhythm: Regular rhythm. Tachycardia present.     Pulses: Normal pulses.          Radial pulses are 2+ on the right side and 2+ on the left side.     Heart sounds: Normal heart sounds. No murmur heard.    No friction rub. No gallop.  Pulmonary:     Effort: Prolonged expiration present.     Breath sounds: Decreased air movement present. Examination of the left-middle field reveals decreased breath sounds. Decreased breath sounds, wheezing and rhonchi present.  Abdominal:     General: Abdomen is flat. Bowel sounds are normal.     Palpations: Abdomen is soft.  Musculoskeletal:     Cervical back: Normal range of motion. Rigidity present. No torticollis. No pain with movement.     Right lower leg: No edema.      Left lower leg: No edema.  Skin:    General: Skin is cool.  Neurological:     General: No focal deficit present.     Mental Status: He is alert and oriented to person, place, and time.     GCS: GCS eye subscore is 4. GCS verbal subscore is 5. GCS motor subscore is 6.     Motor: Weakness present. No tremor or atrophy.  Psychiatric:        Attention and Perception: Attention and perception normal.        Mood and Affect: Mood and affect normal.        Speech: Speech normal.        Behavior: Behavior normal. Behavior is cooperative.     Last CBC Lab Results  Component Value Date   WBC 8.8 07/25/2021   HGB 13.4 07/25/2021   HCT 39.6 07/25/2021   MCV 83.4 07/25/2021   MCH 28.2 07/25/2021   RDW 13.0 07/25/2021   PLT 296 95/28/4132   Last metabolic panel Lab Results  Component Value Date   GLUCOSE 204 (H) 07/25/2021   NA 131 (L) 07/25/2021   K 3.5 07/25/2021   CL 96 (L) 07/25/2021   CO2 25 07/25/2021   BUN 15 07/25/2021   CREATININE 0.76 07/25/2021   GFRNONAA >60 07/25/2021   CALCIUM 9.3 07/25/2021   ALKPHOS 48 09/03/2018   AST 24 09/03/2018   ALT 33 09/03/2018   ANIONGAP 10 07/25/2021   Last lipids No results found for: "CHOL", "HDL", "LDLCALC", "LDLDIRECT", "TRIG", "CHOLHDL" Last hemoglobin A1c Lab Results  Component Value Date   HGBA1C 6.5 09/03/2018   Last thyroid functions No results found for: "TSH", "T3TOTAL", "T4TOTAL", "THYROIDAB" Last vitamin D No results found for: "25OHVITD2", "25OHVITD3", "VD25OH" Last vitamin B12 and Folate No results found for: "VITAMINB12", "FOLATE"      Assessment & Plan:   Problem List Items Addressed This Visit       Cardiovascular and Mediastinum   Hypertension - Primary    Chronic, stable Appears well controlled with Lotensin 20-25 mg PO QD  Reports BP in goal with daily home readings, in goal in office Continue current medications Refill provided today Follow up in 3 months for monitoring with  PCP        Relevant Medications   benazepril-hydrochlorthiazide (LOTENSIN HCT) 20-25 MG tablet     Respiratory   COPD (chronic obstructive pulmonary disease) (HCC)    Chronic, ongoing Currently managed with Spiriva, Albuterol and Advair - appears partially controlled at this time.  Likely exacerbated by lung cancer Dx He is followed by pulmonology and appears to be managed by them for both dx Discussed filling out handicap placard due to difficulty breathing  Continue with current medications  Follow up as needed        Relevant Medications   cetirizine (ZYRTEC) 10 MG tablet   SPIRIVA RESPIMAT 2.5 MCG/ACT AERS   Other Relevant Orders   Spirometry with graph (Completed)     Other   Hyperlipidemia, unspecified    Chronic, ongoing Currently managed with Rosuvastatin 20 mg PO QD  Continue current medications Recommend follow up in 6 weeks with PCP to check labs and assess current regimen       Relevant Medications   benazepril-hydrochlorthiazide (LOTENSIN HCT) 20-25 MG tablet    Return in about 6 weeks (around 09/27/2021) for SOB, HTN, labs .   Cristino Degroff E Jung Yurchak, PA-C  I, Hisae Decoursey E Puneet Selden, PA-C, have reviewed all documentation for this visit. The documentation on 08/16/21 for the exam, diagnosis, procedures, and orders are all accurate and complete. Talitha Givens, MHS, PA-C [facility]

## 2021-08-16 NOTE — Assessment & Plan Note (Signed)
Chronic, ongoing Currently managed with Rosuvastatin 20 mg PO QD  Continue current medications Recommend follow up in 6 weeks with PCP to check labs and assess current regimen

## 2021-08-16 NOTE — Assessment & Plan Note (Signed)
Chronic, stable Appears well controlled with Lotensin 20-25 mg PO QD  Reports BP in goal with daily home readings, in goal in office Continue current medications Refill provided today Follow up in 3 months for monitoring with PCP

## 2021-09-03 ENCOUNTER — Encounter: Payer: Self-pay | Admitting: Nurse Practitioner

## 2021-09-03 ENCOUNTER — Other Ambulatory Visit: Payer: Self-pay | Admitting: Nurse Practitioner

## 2021-09-03 MED ORDER — ALBUTEROL SULFATE HFA 108 (90 BASE) MCG/ACT IN AERS
2.0000 | INHALATION_SPRAY | Freq: Four times a day (QID) | RESPIRATORY_TRACT | 0 refills | Status: DC | PRN
Start: 2021-09-03 — End: 2022-09-04

## 2021-09-27 ENCOUNTER — Encounter: Payer: Self-pay | Admitting: Nurse Practitioner

## 2021-09-27 ENCOUNTER — Ambulatory Visit (INDEPENDENT_AMBULATORY_CARE_PROVIDER_SITE_OTHER): Payer: Medicare Other | Admitting: Nurse Practitioner

## 2021-09-27 VITALS — BP 113/77 | HR 103 | Temp 97.5°F | Wt 168.0 lb

## 2021-09-27 DIAGNOSIS — M25532 Pain in left wrist: Secondary | ICD-10-CM

## 2021-09-27 DIAGNOSIS — I10 Essential (primary) hypertension: Secondary | ICD-10-CM | POA: Diagnosis not present

## 2021-09-27 DIAGNOSIS — J449 Chronic obstructive pulmonary disease, unspecified: Secondary | ICD-10-CM | POA: Diagnosis not present

## 2021-09-27 DIAGNOSIS — E118 Type 2 diabetes mellitus with unspecified complications: Secondary | ICD-10-CM

## 2021-09-27 DIAGNOSIS — C3491 Malignant neoplasm of unspecified part of right bronchus or lung: Secondary | ICD-10-CM | POA: Diagnosis not present

## 2021-09-27 MED ORDER — PREDNISONE 10 MG PO TABS: 10.0000 mg | ORAL_TABLET | Freq: Every day | ORAL | 0 refills | Status: DC

## 2021-09-27 MED ORDER — IBUPROFEN 600 MG PO TABS: 600.0000 mg | ORAL_TABLET | Freq: Two times a day (BID) | ORAL | 0 refills | Status: DC

## 2021-09-27 MED ORDER — BENAZEPRIL-HYDROCHLOROTHIAZIDE 20-25 MG PO TABS: 1.0000 | ORAL_TABLET | Freq: Every day | ORAL | 1 refills | Status: DC

## 2021-09-27 NOTE — Assessment & Plan Note (Signed)
Followed by Oncology and Pulmonology.  Currently on Antibiotics for infection.  Continue to follow their recommendations.  Follow up in 3 months.  Call sooner if concerns arise.

## 2021-09-27 NOTE — Assessment & Plan Note (Signed)
Chronic, ongoing Currently managed with Spiriva, Albuterol and Advair - appears partially controlled at this time.  Likely exacerbated by lung cancer Dx He is followed by pulmonology and appears to be managed by them for both dx Continue with current medications  Follow up in 3 months.  Call sooner if concerns arise.

## 2021-09-27 NOTE — Assessment & Plan Note (Signed)
Chronic.  Controlled.  Continue with current medication regimen.  Labs reviewed from previous visit.  A1c is well controlled at 7.0.   Return to clinic in 3 months for reevaluation.  Call sooner if concerns arise.

## 2021-09-27 NOTE — Progress Notes (Signed)
Subjective    Patient ID: Bradley Bruce, male    DOB: 1955/11/30  Age: 66 y.o. MRN: [id number]  Today's Provider: Talitha Givens, MHS, PA-C Introduced myself to the patient as a PA-C and provided education on APPs in clinical practice.   CC:  Chief Complaint  Patient presents with   Hypertension   Shortness of Breath        Follow-up    Patient states he is going to [facility] every 3 weeks for infusions.   Patient reports he is very fatigued.    Wrist Pain     L wrist pain x 3 weeks. Denies recent fall/injury     HPI Bradley Bruce presents to establish care He is requesting a handicap parking placard.   HYPERTENSION / HYPERLIPIDEMIA Satisfied with current treatment? yes HTN medications: Lotensin 20-25mg ,  Duration of hypertension: chronic BP monitoring frequency: daily BP range: 117/80s  BP medication side effects: yes Duration of hyperlipidemia: chronic Cholesterol medication side effects: no Cholesterol supplements: none HLD medications: Rosuvastatin 20 mg PO QD Medication compliance: excellent compliance Aspirin: no Recent stressors: yes Recurrent headaches: no Visual changes: no Palpitations: no Dyspnea: yes Chest pain: no Lower extremity edema: no Dizzy/lightheaded: no   SHORTNESS OF BREATH- related to ongoing lung cancer  Duration: 4-6 months  Onset: gradual Description of breathing discomfort:  Severity: severe Related to exertion: yes Cough: no Chest tightness: no Wheezing: yes Fevers: no Chest pain: yes, along right side  Palpitations: no  Nausea: yes Diaphoresis: no Deconditioning: yes Status: fluctuating,  Aggravating factors: exertion, going up stairs.  Alleviating factors: rest Treatments attempted: He is taking Spiriva, albuterol, and Advair - states he doesn't notice much of a difference but Albuterol seems to improve chest tightness and pain  He is going to [facility] for lung cancer management  Reports he is having difficulty speaking due to  cancer spread Reports right-sided chest pain and back pain that is relieved with tylenol   WRIST PAIN  Duration:  3 weeks Involved wrist: left Mechanism of injury:  no trauma Location: medial Onset: gradual Severity: moderate  Quality:  sharp Frequency: constant Radiation: no Aggravating factors:  turning wrist and gripping  Alleviating factors:  biofreeze   Status: worse Treatments attempted:  biofreeze     Relief with NSAIDs?:  No NSAIDs Taken Weakness: yes Numbness: no  Redness: no Bruising: no Swelling: no Fevers: no    Outpatient Encounter Medications as of 09/27/2021  Medication Sig   albuterol (VENTOLIN HFA) 108 (90 Base) MCG/ACT inhaler Inhale 2 puffs into the lungs every 6 (six) hours as needed for shortness of breath or wheezing.   Camphor-Eucalyptus-Menthol (VICKS VAPORUB EX) Apply 1 application topically at bedtime.   cetirizine (ZYRTEC) 10 MG tablet Take by mouth.   durvalumab 10 mg/kg in sodium chloride 0.9 % 100 mL Inject 10 mg/kg into the vein once.   fluticasone-salmeterol (ADVAIR DISKUS) 250-50 MCG/ACT AEPB Inhale 1 puff into the lungs in the morning and at bedtime.   ibuprofen (ADVIL) 600 MG tablet Take 1 tablet (600 mg total) by mouth in the morning and at bedtime.   KLOR-CON M20 20 MEQ tablet Take 20 mEq by mouth daily.   metFORMIN (GLUCOPHAGE-XR) 750 MG 24 hr tablet Take 750 mg by mouth at bedtime.   omeprazole (PRILOSEC) 40 MG capsule Take 40 mg by mouth daily.   Potassium Chloride ER 20 MEQ TBCR Take 1 tablet by mouth daily.   predniSONE (DELTASONE) 10 MG tablet Take 1  tablet (10 mg total) by mouth daily with breakfast. Take 6 today, 5 tomorrow and decrease by 1 each day until course is complete   prochlorperazine (COMPAZINE) 10 MG tablet Take 10 mg by mouth every 6 (six) hours as needed.   rosuvastatin (CRESTOR) 20 MG tablet Take 20 mg by mouth daily.   SPIRIVA RESPIMAT 2.5 MCG/ACT AERS SMARTSIG:2 Puff(s) Via Inhaler Daily   [DISCONTINUED]  benazepril-hydrochlorthiazide (LOTENSIN HCT) 20-25 MG tablet Take 1 tablet by mouth daily.   benazepril-hydrochlorthiazide (LOTENSIN HCT) 20-25 MG tablet Take 1 tablet by mouth daily.   No facility-administered encounter medications on file as of 09/27/2021.    Past Medical History:  Diagnosis Date   Allergy    Asthma    Cancer ([place]) 08/2020   lung mass probably metastatic   Chronic cough 08/25/2020   COPD (chronic obstructive pulmonary disease) ([place])    Diabetes mellitus without complication ([place])    Dyspnea 08/2020   worsening now   ED (erectile dysfunction)    Fatigue 08/25/2020   Hemoptysis 08/25/2020   still continues for a few days, then stops for a few days and then starts up again   History of kidney stones 1990   passed stone without surgery   Hypertension    Sciatica of right side    Takes gabapentin to help    Past Surgical History:  Procedure Laterality Date   COLONOSCOPY     VIDEO BRONCHOSCOPY WITH ENDOBRONCHIAL ULTRASOUND Right 08/30/2020   Procedure: VIDEO BRONCHOSCOPY WITH ENDOBRONCHIAL ULTRASOUND;  Surgeon: Flora Lipps, MD;  Location: [facility] ORS;  Service: Pulmonary;  Laterality: Right;    Family History  Problem Relation Age of Onset   Cervical cancer Mother    Heart disease Father    Lung cancer Sister     Social History   Socioeconomic History   Marital status: Married    Spouse name: Helene Kelp   Number of children: 2   Years of education: Not on file   Highest education level: Not on file  Occupational History   Occupation: Radiation protection practitioner area Copywriter, advertising    Comment: retired  Tobacco Use   Smoking status: Former    Packs/day: 1.50    Years: 46.00    Total pack years: 69.00    Types: Cigarettes    Quit date: 07/21/2020    Years since quitting: 1.1    Passive exposure: Past   Smokeless tobacco: Never  Vaping Use   Vaping Use: Never used  Substance and Sexual Activity   Alcohol use: Not Currently    Comment: rare- occasional beer   Drug use:  Never   Sexual activity: Yes  Other Topics Concern   Not on file  Social History Narrative   Patient lives with his wife of 45 years.  Patient feels safe in his home.   Has a dog and a cat.   Social Determinants of Health   Financial Resource Strain: Not on file  Food Insecurity: Not on file  Transportation Needs: Not on file  Physical Activity: Not on file  Stress: Not on file  Social Connections: Not on file  Intimate Partner Violence: Not on file    Review of Systems  Constitutional:  Negative for chills, diaphoresis, fever and malaise/fatigue.  Eyes:  Negative for blurred vision, double vision and photophobia.  Respiratory:  Positive for shortness of breath and wheezing.   Cardiovascular:  Negative for chest pain, palpitations and leg swelling.  Gastrointestinal:  Negative for constipation, diarrhea, heartburn, nausea  and vomiting.  Musculoskeletal:  Negative for back pain and falls.       Wrist pain  Skin:  Negative for itching and rash.  Neurological:  Negative for dizziness and headaches.  Psychiatric/Behavioral:  Negative for depression and memory loss. The patient is not nervous/anxious and does not have insomnia.         Objective    BP 113/77   Pulse (!) 103   Temp (!) 97.5 F (36.4 C) (Oral)   Wt 168 lb (76.2 kg)   SpO2 94%   BMI 24.24 kg/m   Physical Exam Vitals reviewed.  Constitutional:      Appearance: Normal appearance. He is normal weight.  HENT:     Head: Normocephalic and atraumatic.  Eyes:     General: Lids are normal.     Pupils: Pupils are equal, round, and reactive to light.  Neck:     Thyroid: No thyroid mass, thyromegaly or thyroid tenderness.     Comments: Mild rigidity noted on right side of neck Phonation is overall hoarse and seems forced  Cardiovascular:     Rate and Rhythm: Regular rhythm. Tachycardia present.     Pulses: Normal pulses.          Radial pulses are 2+ on the right side and 2+ on the left side.     Heart  sounds: Normal heart sounds. No murmur heard.    No friction rub. No gallop.  Pulmonary:     Effort: Prolonged expiration present.     Breath sounds: Decreased air movement present. Examination of the left-middle field reveals decreased breath sounds. Decreased breath sounds, wheezing and rhonchi present.  Abdominal:     General: Abdomen is flat. Bowel sounds are normal.     Palpations: Abdomen is soft.  Musculoskeletal:     Cervical back: Normal range of motion. Rigidity present. No torticollis. No pain with movement.     Right lower leg: No edema.     Left lower leg: No edema.  Skin:    General: Skin is cool.  Neurological:     General: No focal deficit present.     Mental Status: He is alert and oriented to person, place, and time.     GCS: GCS eye subscore is 4. GCS verbal subscore is 5. GCS motor subscore is 6.     Motor: Weakness present. No tremor or atrophy.  Psychiatric:        Attention and Perception: Attention and perception normal.        Mood and Affect: Mood and affect normal.        Speech: Speech normal.        Behavior: Behavior normal. Behavior is cooperative.     Last CBC Lab Results  Component Value Date   WBC 8.8 07/25/2021   HGB 13.4 07/25/2021   HCT 39.6 07/25/2021   MCV 83.4 07/25/2021   MCH 28.2 07/25/2021   RDW 13.0 07/25/2021   PLT 296 38/18/2993   Last metabolic panel Lab Results  Component Value Date   GLUCOSE 204 (H) 07/25/2021   NA 131 (L) 07/25/2021   K 3.5 07/25/2021   CL 96 (L) 07/25/2021   CO2 25 07/25/2021   BUN 15 07/25/2021   CREATININE 0.76 07/25/2021   GFRNONAA >60 07/25/2021   CALCIUM 9.3 07/25/2021   ALKPHOS 48 09/03/2018   AST 24 09/03/2018   ALT 33 09/03/2018   ANIONGAP 10 07/25/2021   Last lipids No results found for: "CHOL", "  HDL", "[place]", "LDLDIRECT", "TRIG", "CHOLHDL" Last hemoglobin A1c Lab Results  Component Value Date   HGBA1C 6.5 09/03/2018   Last thyroid functions No results found for: "TSH",  "T3TOTAL", "T4TOTAL", "THYROIDAB" Last vitamin D No results found for: "25OHVITD2", "25OHVITD3", "VD25OH" Last vitamin B12 and Folate No results found for: "VITAMINB12", "FOLATE"      Assessment & Plan:   Problem List Items Addressed This Visit       Cardiovascular and Mediastinum   Hypertension    Chronic.  Controlled.  Continue with current medication regimen of Lotensin. Reviewed labs from previous visits.   Return to clinic in 6 months for reevaluation.  Call sooner if concerns arise.        Relevant Medications   benazepril-hydrochlorthiazide (LOTENSIN HCT) 20-25 MG tablet     Respiratory   COPD (chronic obstructive pulmonary disease) (HCC) - Primary    Chronic, ongoing Currently managed with Spiriva, Albuterol and Advair - appears partially controlled at this time.  Likely exacerbated by lung cancer Dx He is followed by pulmonology and appears to be managed by them for both dx Continue with current medications  Follow up in 3 months.  Call sooner if concerns arise.       Relevant Medications   predniSONE (DELTASONE) 10 MG tablet   Squamous cell carcinoma lung, right ([place])    Followed by Oncology and Pulmonology.  Currently on Antibiotics for infection.  Continue to follow their recommendations.  Follow up in 3 months.  Call sooner if concerns arise.      Relevant Medications   predniSONE (DELTASONE) 10 MG tablet     Endocrine   Controlled type 2 diabetes mellitus with complication, without long-term current use of insulin (HCC)    Chronic.  Controlled.  Continue with current medication regimen.  Labs reviewed from previous visit.  A1c is well controlled at 7.0.   Return to clinic in 3 months for reevaluation.  Call sooner if concerns arise.        Relevant Medications   benazepril-hydrochlorthiazide (LOTENSIN HCT) 20-25 MG tablet   Other Visit Diagnoses     Left wrist pain       suspect tendonitis. Will treat with prednisone burst and Ibuprofen.  Follow up  if symptoms do not improve. Will need to see Ortho.      Return in about 3 months (around 12/28/2021) for HTN, HLD, DM2 FU.   Jon Billings, NP

## 2021-09-27 NOTE — Assessment & Plan Note (Signed)
Chronic.  Controlled.  Continue with current medication regimen of Lotensin. Reviewed labs from previous visits.   Return to clinic in 6 months for reevaluation.  Call sooner if concerns arise.

## 2021-10-04 ENCOUNTER — Encounter: Payer: Self-pay | Admitting: Emergency Medicine

## 2021-10-04 ENCOUNTER — Ambulatory Visit
Admission: EM | Admit: 2021-10-04 | Discharge: 2021-10-04 | Disposition: A | Discharge status: Home / Self Care | Payer: Medicare Other | Attending: Emergency Medicine | Admitting: Emergency Medicine

## 2021-10-04 ENCOUNTER — Ambulatory Visit (INDEPENDENT_AMBULATORY_CARE_PROVIDER_SITE_OTHER): Payer: Medicare Other

## 2021-10-04 DIAGNOSIS — R059 Cough, unspecified: Secondary | ICD-10-CM

## 2021-10-04 DIAGNOSIS — R042 Hemoptysis: Secondary | ICD-10-CM | POA: Diagnosis not present

## 2021-10-04 DIAGNOSIS — R0602 Shortness of breath: Secondary | ICD-10-CM | POA: Diagnosis not present

## 2021-10-04 DIAGNOSIS — J441 Chronic obstructive pulmonary disease with (acute) exacerbation: Secondary | ICD-10-CM | POA: Diagnosis not present

## 2021-10-04 MED ORDER — AEROCHAMBER MV MISC: 2 refills | Status: DC

## 2021-10-04 MED ORDER — BENZONATATE 100 MG PO CAPS: 200.0000 mg | ORAL_CAPSULE | Freq: Three times a day (TID) | ORAL | 0 refills | Status: DC

## 2021-10-04 MED ORDER — PREDNISONE 20 MG PO TABS: 60.0000 mg | ORAL_TABLET | Freq: Every day | ORAL | 0 refills | Status: AC

## 2021-10-04 MED ORDER — PROMETHAZINE-DM 6.25-15 MG/5ML PO SYRP: 5.0000 mL | ORAL_SOLUTION | Freq: Four times a day (QID) | ORAL | 0 refills | Status: DC | PRN

## 2021-10-04 NOTE — ED Provider Notes (Signed)
[facility]    CSN: [id number] Arrival date & time: 10/04/21  1728      History   Chief Complaint Chief Complaint  Patient presents with   Shortness of Breath   Cough   Wheezing    HPI Bradley Bruce is a 66 y.o. male.   HPI  85 old male here for evaluation of respiratory complaints.  Patient reports that for the last 2 days he has been experiencing a cough for dark mucus, hoarseness, increasing shortness of breath, and wheezing.  He has a history of stage IV lung cancer and was admitted for community-acquired pneumonia on 09/09/2021.  He completed 10-day course of Augmentin following his discharge from the hospital.  He denies any fever, runny nose, nasal congestion, or hemoptysis.  Has been using his albuterol inhaler at home but not with a spacer.  He states that the albuterol does help with his shortness of breath and wheezing.  Past Medical History:  Diagnosis Date   Allergy    Asthma    Cancer ([place]) 08/2020   lung mass probably metastatic   Chronic cough 08/25/2020   COPD (chronic obstructive pulmonary disease) ([place])    Diabetes mellitus without complication ([place])    Dyspnea 08/2020   worsening now   ED (erectile dysfunction)    Fatigue 08/25/2020   Hemoptysis 08/25/2020   still continues for a few days, then stops for a few days and then starts up again   History of kidney stones 1990   passed stone without surgery   Hypertension    Sciatica of right side    Takes gabapentin to help    Patient Active Problem List   Diagnosis Date Noted   Controlled type 2 diabetes mellitus with complication, without long-term current use of insulin ([place]) 09/27/2021   Chronic cough 04/17/2021   Squamous cell carcinoma lung, 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   Goals of care, counseling/discussion 09/01/2020   Malignant neoplasm of hilus of right lung ([place]) 08/31/2020   COPD (chronic obstructive pulmonary disease) ([place]) 08/26/2019    Hyperlipidemia, unspecified 08/26/2019   Hypertension 08/26/2019   Seasonal allergies 08/26/2019   Combined arterial insufficiency and corporo-venous occlusive erectile dysfunction 12/06/2015    Past Surgical History:  Procedure Laterality Date   COLONOSCOPY     VIDEO BRONCHOSCOPY WITH ENDOBRONCHIAL ULTRASOUND Right 08/30/2020   Procedure: VIDEO BRONCHOSCOPY WITH ENDOBRONCHIAL ULTRASOUND;  Surgeon: Flora Lipps, MD;  Location: [facility] ORS;  Service: Pulmonary;  Laterality: Right;       Home Medications    Prior to Admission medications   Medication Sig Start Date End Date Taking? Authorizing Provider  benzonatate (TESSALON) 100 MG capsule Take 2 capsules (200 mg total) by mouth every 8 (eight) hours. 10/04/21  Yes Margarette Canada, NP  predniSONE (DELTASONE) 20 MG tablet Take 3 tablets (60 mg total) by mouth daily with breakfast for 5 days. 3 tablets daily for 5 days. 10/04/21 10/09/21 Yes Margarette Canada, NP  promethazine-dextromethorphan (PROMETHAZINE-DM) 6.25-15 MG/5ML syrup Take 5 mLs by mouth 4 (four) times daily as needed. 10/04/21  Yes Margarette Canada, NP  Spacer/Aero-Holding Josiah Lobo (AEROCHAMBER MV) inhaler Use as instructed 10/04/21  Yes Margarette Canada, NP  albuterol (VENTOLIN HFA) 108 (90 Base) MCG/ACT inhaler Inhale 2 puffs into the lungs every 6 (six) hours as needed for shortness of breath or wheezing. 09/03/21   Jon Billings, NP  benazepril-hydrochlorthiazide (LOTENSIN HCT) 20-25 MG tablet Take 1 tablet by mouth daily. 09/27/21   Jon Billings,  NP  Camphor-Eucalyptus-Menthol (VICKS VAPORUB EX) Apply 1 application topically at bedtime.    [provider]  cetirizine (ZYRTEC) 10 MG tablet Take by mouth.    [provider]  durvalumab 10 mg/kg in sodium chloride 0.9 % 100 mL Inject 10 mg/kg into the vein once.    [provider]  fluticasone-salmeterol (ADVAIR DISKUS) 250-50 MCG/ACT AEPB Inhale 1 puff into the lungs in the morning and at bedtime. 04/17/21   Parrett,  Fonnie Mu, NP  ibuprofen (ADVIL) 600 MG tablet Take 1 tablet (600 mg total) by mouth in the morning and at bedtime. 09/27/21   Jon Billings, NP  KLOR-CON M20 20 MEQ tablet Take 20 mEq by mouth daily. 01/05/21   [provider]  metFORMIN (GLUCOPHAGE-XR) 750 MG 24 hr tablet Take 750 mg by mouth at bedtime. 09/27/19   [provider]  omeprazole (PRILOSEC) 40 MG capsule Take 40 mg by mouth daily. 03/27/21   [provider]  Potassium Chloride ER 20 MEQ TBCR Take 1 tablet by mouth daily. 06/22/21   [provider]  prochlorperazine (COMPAZINE) 10 MG tablet Take 10 mg by mouth every 6 (six) hours as needed. 08/15/21   [provider]  rosuvastatin (CRESTOR) 20 MG tablet Take 20 mg by mouth daily. 07/16/20   [provider]  SPIRIVA RESPIMAT 2.5 MCG/ACT AERS SMARTSIG:2 Puff(s) Via Inhaler Daily 07/23/21   [provider]    Family History Family History  Problem Relation Age of Onset   Cervical cancer Mother    Heart disease Father    Lung cancer Sister     Social History Social History   Tobacco Use   Smoking status: Former    Packs/day: 1.50    Years: 46.00    Total pack years: 69.00    Types: Cigarettes    Quit date: 07/21/2020    Years since quitting: 1.2    Passive exposure: Past   Smokeless tobacco: Never  Vaping Use   Vaping Use: Never used  Substance Use Topics   Alcohol use: Not Currently    Comment: rare- occasional beer   Drug use: Never     Allergies   Patient has no known allergies.   Review of Systems Review of Systems  Constitutional:  Negative for fever.  Respiratory:  Positive for cough, shortness of breath and wheezing.      Physical Exam Triage Vital Signs ED Triage Vitals [10/04/21 1744]  Enc Vitals Group     BP 130/81     Pulse Rate (!) 111     Resp 18     Temp 98.3 F (36.8 C)     Temp Source Oral     SpO2 98 %     Weight      Height      Head Circumference      Peak Flow       Pain Score      Pain Loc      Pain Edu?      Excl. in [place]?    No data found.  Updated Vital Signs BP 130/81 (BP Location: Left Arm)   Pulse (!) 111   Temp 98.3 F (36.8 C) (Oral)   Resp 18   SpO2 98%   Visual Acuity Right Eye Distance:   Left Eye Distance:   Bilateral Distance:    Right Eye Near:   Left Eye Near:    Bilateral Near:     Physical Exam Vitals and nursing  note reviewed.  Constitutional:      Appearance: Normal appearance. He is ill-appearing.  HENT:     Head: Normocephalic and atraumatic.  Cardiovascular:     Rate and Rhythm: Normal rate and regular rhythm.     Pulses: Normal pulses.     Heart sounds: Normal heart sounds. No murmur heard.    No friction rub. No gallop.  Pulmonary:     Effort: Pulmonary effort is normal.     Breath sounds: Wheezing and rhonchi present. No rales.  Skin:    General: Skin is warm and dry.     Capillary Refill: Capillary refill takes less than 2 seconds.     Findings: No erythema.  Neurological:     General: No focal deficit present.     Mental Status: He is alert and oriented to person, place, and time.  Psychiatric:        Mood and Affect: Mood normal.        Behavior: Behavior normal.        Thought Content: Thought content normal.        Judgment: Judgment normal.      UC Treatments / Results  Labs (all labs ordered are listed, but only abnormal results are displayed) Labs Reviewed - No data to display  EKG   Radiology DG Chest 2 View  Result Date: 10/04/2021 CLINICAL DATA:  Cough, hemoptysis, shortness of breath, lung carcinoma EXAM: CHEST - 2 VIEW COMPARISON:  Previous studies including the chest radiographs and CT done on 07/25/2021 FINDINGS: Cardiac size is within normal limits. There is prominence of right hilum and right paratracheal soft tissues in the mediastinum. There is interval placement of a stent in the right main bronchus. Lung fields are clear of any new infiltrates or signs of pulmonary  edema. There is no significant pleural effusion or pneumothorax. IMPRESSION: Prominence of right hilum and right paratracheal soft tissues is consistent with central neoplastic process seen in the previous PET-CT. There is interval placement of stent in the right main bronchus. There are no new infiltrates or signs of pulmonary edema. There is no significant pleural effusion. Electronically Signed   By: Elmer Picker M.D.   On: 10/04/2021 18:25    Procedures Procedures (including critical care time)  Medications Ordered in UC Medications - No data to display  Initial Impression / Assessment and Plan / UC Course  I have reviewed the triage vital signs and the nursing notes.  Pertinent labs & imaging results that were available during my care of the patient were reviewed by me and considered in my medical decision making (see chart for details).  Patient is a pleasant, though ill-appearing, 66 year old male here for evaluation of respiratory complaints as outlined in HPI above.  He does have a history of stage IV lung cancer and recently was admitted for community-acquired pneumonia.  He was discharged on a 14-day course of Augmentin in July.  Physical exam reveals S1-S2 heart sounds with regular rate and rhythm and lung sounds that have diffuse wheezing and scattered rhonchi in all lung fields.  Patient does not demonstrate any dyspnea or tachypnea and he is able to speak in full sentences.  His oxygen saturation here is 98% on room air with a respiratory rate of 98%.  He is afebrile at 98.3.  I will order a chest x-ray to look for acute thoracic process.  Radiology impression of chest x-ray states that there are no new infiltrates or signs of pulmonary edema.  No significant pleural effusion.  I will discharge patient home with a diagnosis of COPD exacerbation.  I will have him continue to use his albuterol inhaler with a spacer, 2 puffs every 4-6 hours as needed for shortness breath and  wheezing.  I will also prescribe Tessalon Perles and Promethazine DM cough syrup.  I am also going to prescribe a second round of burst dose prednisone 60 mg a day for 5 days starting tomorrow morning.   Final Clinical Impressions(s) / UC Diagnoses   Final diagnoses:  COPD exacerbation [facility])     Discharge Instructions      Your chest x-ray did not demonstrate any evidence of pneumonia.  I do believe you have an exacerbation of your COPD.  Use the albuterol inhaler, with a spacer, 2 puffs every 4-6 hours, as needed for shortness of breath and wheezing.  Starting tomorrow morning take prednisone 60 mg daily as a burst dose.  She will take this each morning for 5 days.  Use Tessalon Perles every 8 hours during the day as needed for cough.  Use the Promethazine DM cough syrup as needed at nighttime for cough and congestion.  If you have any new or worsening symptoms please return for reevaluation.     ED Prescriptions     Medication Sig Dispense Auth. Provider   Spacer/Aero-Holding Chambers (AEROCHAMBER MV) inhaler Use as instructed 1 each Margarette Canada, NP   benzonatate (TESSALON) 100 MG capsule Take 2 capsules (200 mg total) by mouth every 8 (eight) hours. 21 capsule Margarette Canada, NP   predniSONE (DELTASONE) 20 MG tablet Take 3 tablets (60 mg total) by mouth daily with breakfast for 5 days. 3 tablets daily for 5 days. 15 tablet Margarette Canada, NP   promethazine-dextromethorphan (PROMETHAZINE-DM) 6.25-15 MG/5ML syrup Take 5 mLs by mouth 4 (four) times daily as needed. 118 mL Margarette Canada, NP      PDMP not reviewed this encounter.   Margarette Canada, NP 10/04/21 [phone]

## 2021-10-04 NOTE — ED Triage Notes (Signed)
Pt presents with cough, SOB, wheezing x 2 days. Pt has stage 4 lung cancer. Pt was prescribed prednisone on 7/27 and an abx but unsure which one.

## 2021-10-04 NOTE — Discharge Instructions (Addendum)
Your chest x-ray did not demonstrate any evidence of pneumonia.  I do believe you have an exacerbation of your COPD.  Use the albuterol inhaler, with a spacer, 2 puffs every 4-6 hours, as needed for shortness of breath and wheezing.  Starting tomorrow morning take prednisone 60 mg daily as a burst dose.  She will take this each morning for 5 days.  Use Tessalon Perles every 8 hours during the day as needed for cough.  Use the Promethazine DM cough syrup as needed at nighttime for cough and congestion.  If you have any new or worsening symptoms please return for reevaluation.

## 2021-11-06 ENCOUNTER — Other Ambulatory Visit: Payer: Self-pay | Admitting: Orthopedic Surgery

## 2021-11-06 ENCOUNTER — Other Ambulatory Visit (HOSPITAL_COMMUNITY): Payer: Self-pay | Admitting: Orthopedic Surgery

## 2021-11-06 DIAGNOSIS — M25532 Pain in left wrist: Secondary | ICD-10-CM

## 2021-11-08 ENCOUNTER — Ambulatory Visit
Admission: RE | Admit: 2021-11-08 | Discharge: 2021-11-08 | Disposition: A | Discharge status: Home / Self Care | Payer: Medicare Other | Source: Ambulatory Visit | Attending: Orthopedic Surgery | Admitting: Orthopedic Surgery

## 2021-11-08 DIAGNOSIS — M25532 Pain in left wrist: Secondary | ICD-10-CM | POA: Diagnosis not present

## 2021-11-12 ENCOUNTER — Telehealth: Payer: Self-pay | Admitting: Nurse Practitioner

## 2021-11-12 NOTE — Telephone Encounter (Signed)
Copied from [place] [id number]. Topic: Medicare AWV >> Nov 12, 2021  3:14 PM Josephina Gip wrote: Reason for CRM:  Left message for patient to call back and schedule Medicare Annual Wellness Visit (AWV) to be done virtually or by telephone.  No hx of AWV eligible as of 12/02/20  Please schedule at anytime with [facility]Nurse Health Advisor.      7 Minutes appointment   Any questions, please call me at [phone]

## 2021-11-14 ENCOUNTER — Ambulatory Visit (INDEPENDENT_AMBULATORY_CARE_PROVIDER_SITE_OTHER): Payer: Medicare Other | Admitting: *Deleted

## 2021-11-14 DIAGNOSIS — Z0000 Encounter for general adult medical examination without abnormal findings: Secondary | ICD-10-CM | POA: Diagnosis not present

## 2021-11-14 NOTE — Patient Instructions (Signed)
Bradley Bruce , Thank you for taking time to come for your Medicare Wellness Visit. I appreciate your ongoing commitment to your health goals. Please review the following plan we discussed and let me know if I can assist you in the future.   Screening recommendations/referrals: Colonoscopy: up to date Recommended yearly ophthalmology/optometry visit for glaucoma screening and checkup Recommended yearly dental visit for hygiene and checkup  Vaccinations: Influenza vaccine: up to date Pneumococcal vaccine: up to date Tdap vaccine: up to date Shingles vaccine: up to date    Advanced directives: on file at [place]  Conditions/risks identified:   Next appointment: 12-28-2021 @ 11:00  [facility] 65 Years and Older, Male Preventive care refers to lifestyle choices and visits with your health care provider that can promote health and wellness. What does preventive care include? A yearly physical exam. This is also called an annual well check. Dental exams once or twice a year. Routine eye exams. Ask your health care provider how often you should have your eyes checked. Personal lifestyle choices, including: Daily care of your teeth and gums. Regular physical activity. Eating a healthy diet. Avoiding tobacco and drug use. Limiting alcohol use. Practicing safe sex. Taking low doses of aspirin every day. Taking vitamin and mineral supplements as recommended by your health care provider. What happens during an annual well check? The services and screenings done by your health care provider during your annual well check will depend on your age, overall health, lifestyle risk factors, and family history of disease. Counseling  Your health care provider may ask you questions about your: Alcohol use. Tobacco use. Drug use. Emotional well-being. Home and relationship well-being. Sexual activity. Eating habits. History of falls. Memory and ability to understand  (cognition). Work and work Statistician. Screening  You may have the following tests or measurements: Height, weight, and BMI. Blood pressure. Lipid and cholesterol levels. These may be checked every 5 years, or more frequently if you are over 67 years old. Skin check. Lung cancer screening. You may have this screening every year starting at age 31 if you have a 30-pack-year history of smoking and currently smoke or have quit within the past 15 years. Fecal occult blood test (FOBT) of the stool. You may have this test every year starting at age 34. Flexible sigmoidoscopy or colonoscopy. You may have a sigmoidoscopy every 5 years or a colonoscopy every 10 years starting at age 16. Prostate cancer screening. Recommendations will vary depending on your family history and other risks. Hepatitis C blood test. Hepatitis B blood test. Sexually transmitted disease (STD) testing. Diabetes screening. This is done by checking your blood sugar (glucose) after you have not eaten for a while (fasting). You may have this done every 1-3 years. Abdominal aortic aneurysm (AAA) screening. You may need this if you are a current or former smoker. Osteoporosis. You may be screened starting at age 62 if you are at high risk. Talk with your health care provider about your test results, treatment options, and if necessary, the need for more tests. Vaccines  Your health care provider may recommend certain vaccines, such as: Influenza vaccine. This is recommended every year. Tetanus, diphtheria, and acellular pertussis (Tdap, Td) vaccine. You may need a Td booster every 10 years. Zoster vaccine. You may need this after age 71. Pneumococcal 13-valent conjugate (PCV13) vaccine. One dose is recommended after age 40. Pneumococcal polysaccharide (PPSV23) vaccine. One dose is recommended after age 85. Talk to your health care provider about  which screenings and vaccines you need and how often you need them. This  information is not intended to replace advice given to you by your health care provider. Make sure you discuss any questions you have with your health care provider. Document Released: 03/17/2015 Document Revised: 11/08/2015 Document Reviewed: 12/20/2014 Elsevier Interactive Patient Education  2017 [employer] Prevention in the Home Falls can cause injuries. They can happen to people of all ages. There are many things you can do to make your home safe and to help prevent falls. What can I do on the outside of my home? Regularly fix the edges of walkways and driveways and fix any cracks. Remove anything that might make you trip as you walk through a door, such as a raised step or threshold. Trim any bushes or trees on the path to your home. Use bright outdoor lighting. Clear any walking paths of anything that might make someone trip, such as rocks or tools. Regularly check to see if handrails are loose or broken. Make sure that both sides of any steps have handrails. Any raised decks and porches should have guardrails on the edges. Have any leaves, snow, or ice cleared regularly. Use sand or salt on walking paths during winter. Clean up any spills in your garage right away. This includes oil or grease spills. What can I do in the bathroom? Use night lights. Install grab bars by the toilet and in the tub and shower. Do not use towel bars as grab bars. Use non-skid mats or decals in the tub or shower. If you need to sit down in the shower, use a plastic, non-slip stool. Keep the floor dry. Clean up any water that spills on the floor as soon as it happens. Remove soap buildup in the tub or shower regularly. Attach bath mats securely with double-sided non-slip rug tape. Do not have throw rugs and other things on the floor that can make you trip. What can I do in the bedroom? Use night lights. Make sure that you have a light by your bed that is easy to reach. Do not use any sheets or  blankets that are too big for your bed. They should not hang down onto the floor. Have a firm chair that has side arms. You can use this for support while you get dressed. Do not have throw rugs and other things on the floor that can make you trip. What can I do in the kitchen? Clean up any spills right away. Avoid walking on wet floors. Keep items that you use a lot in easy-to-reach places. If you need to reach something above you, use a strong step stool that has a grab bar. Keep electrical cords out of the way. Do not use floor polish or wax that makes floors slippery. If you must use wax, use non-skid floor wax. Do not have throw rugs and other things on the floor that can make you trip. What can I do with my stairs? Do not leave any items on the stairs. Make sure that there are handrails on both sides of the stairs and use them. Fix handrails that are broken or loose. Make sure that handrails are as long as the stairways. Check any carpeting to make sure that it is firmly attached to the stairs. Fix any carpet that is loose or worn. Avoid having throw rugs at the top or bottom of the stairs. If you do have throw rugs, attach them to the floor with  carpet tape. Make sure that you have a light switch at the top of the stairs and the bottom of the stairs. If you do not have them, ask someone to add them for you. What else can I do to help prevent falls? Wear shoes that: Do not have high heels. Have rubber bottoms. Are comfortable and fit you well. Are closed at the toe. Do not wear sandals. If you use a stepladder: Make sure that it is fully opened. Do not climb a closed stepladder. Make sure that both sides of the stepladder are locked into place. Ask someone to hold it for you, if possible. Clearly mark and make sure that you can see: Any grab bars or handrails. First and last steps. Where the edge of each step is. Use tools that help you move around (mobility aids) if they are  needed. These include: Canes. Walkers. Scooters. Crutches. Turn on the lights when you go into a dark area. Replace any light bulbs as soon as they burn out. Set up your furniture so you have a clear path. Avoid moving your furniture around. If any of your floors are uneven, fix them. If there are any pets around you, be aware of where they are. Review your medicines with your doctor. Some medicines can make you feel dizzy. This can increase your chance of falling. Ask your doctor what other things that you can do to help prevent falls. This information is not intended to replace advice given to you by your health care provider. Make sure you discuss any questions you have with your health care provider. Document Released: 12/15/2008 Document Revised: 07/27/2015 Document Reviewed: 03/25/2014 Elsevier Interactive Patient Education  2017 [employer].

## 2021-11-14 NOTE — Progress Notes (Signed)
Subjective:   Bradley Bruce is a 66 y.o. male who presents for an Initial Medicare Annual Wellness Visit.  I connected with  Neil Crouch on 11/14/21 by a telephone enabled telemedicine application and verified that I am speaking with the correct person using two identifiers.   I discussed the limitations of evaluation and management by telemedicine. The patient expressed understanding and agreed to proceed.  Patient location: home  Provider location: Tele-Health-home     Review of Systems     Cardiac Risk Factors include: advanced age (>51men, >31 women);diabetes mellitus;family history of premature cardiovascular disease;hypertension;male gender;sedentary lifestyle     Objective:    Today's Vitals   11/14/21 1034  PainSc: 7    There is no height or weight on file to calculate BMI.     11/14/2021   10:36 AM 10/04/2021    5:50 PM 07/25/2021    5:41 PM 09/01/2020    9:31 AM 08/30/2020   10:53 AM 08/25/2020    2:33 PM  Advanced Directives  Does Patient Have a Medical Advance Directive? Yes Yes No Yes No Yes  Type of Paramedic of [place];Living will   [place];Living will Healthcare Power of [place];Living will  Does patient want to make changes to medical advance directive?  No - Patient declined  No - Patient declined No - Patient declined No - Patient declined  Copy of [place] in Chart? Yes - validated most recent copy scanned in chart (See row information)   No - copy requested No - copy requested Yes - validated most recent copy scanned in chart (See row information)  Would patient like information on creating a medical advance directive?     No - Patient declined     Current Medications (verified) Outpatient Encounter Medications as of 11/14/2021  Medication Sig   albuterol (VENTOLIN HFA) 108 (90 Base) MCG/ACT inhaler Inhale 2 puffs into the lungs every 6 (six) hours as needed for  shortness of breath or wheezing.   benazepril-hydrochlorthiazide (LOTENSIN HCT) 20-25 MG tablet Take 1 tablet by mouth daily.   Camphor-Eucalyptus-Menthol (VICKS VAPORUB EX) Apply 1 application topically at bedtime.   cetirizine (ZYRTEC) 10 MG tablet Take by mouth.   durvalumab 10 mg/kg in sodium chloride 0.9 % 100 mL Inject 10 mg/kg into the vein once.   fluticasone-salmeterol (ADVAIR DISKUS) 250-50 MCG/ACT AEPB Inhale 1 puff into the lungs in the morning and at bedtime.   KLOR-CON M20 20 MEQ tablet Take 20 mEq by mouth daily.   metFORMIN (GLUCOPHAGE-XR) 750 MG 24 hr tablet Take 750 mg by mouth at bedtime.   morphine (MSIR) 15 MG tablet Take 15 mg by mouth every 4 (four) hours as needed for severe pain.   omeprazole (PRILOSEC) 40 MG capsule Take 40 mg by mouth daily.   Potassium Chloride ER 20 MEQ TBCR Take 1 tablet by mouth daily.   rosuvastatin (CRESTOR) 20 MG tablet Take 20 mg by mouth daily.   Spacer/Aero-Holding Chambers (AEROCHAMBER MV) inhaler Use as instructed   SPIRIVA RESPIMAT 2.5 MCG/ACT AERS SMARTSIG:2 Puff(s) Via Inhaler Daily   benzonatate (TESSALON) 100 MG capsule Take 2 capsules (200 mg total) by mouth every 8 (eight) hours. (Patient not taking: Reported on 11/14/2021)   ibuprofen (ADVIL) 600 MG tablet Take 1 tablet (600 mg total) by mouth in the morning and at bedtime. (Patient not taking: Reported on 11/14/2021)   prochlorperazine (COMPAZINE) 10 MG tablet Take 10  mg by mouth every 6 (six) hours as needed. (Patient not taking: Reported on 11/14/2021)   promethazine-dextromethorphan (PROMETHAZINE-DM) 6.25-15 MG/5ML syrup Take 5 mLs by mouth 4 (four) times daily as needed. (Patient not taking: Reported on 11/14/2021)   No facility-administered encounter medications on file as of 11/14/2021.    Allergies (verified) Patient has no known allergies.   History: Past Medical History:  Diagnosis Date   Allergy    Asthma    Cancer ([place]) 08/2020   lung mass probably metastatic    Chronic cough 08/25/2020   COPD (chronic obstructive pulmonary disease) ([place])    Diabetes mellitus without complication ([place])    Dyspnea 08/2020   worsening now   ED (erectile dysfunction)    Fatigue 08/25/2020   Hemoptysis 08/25/2020   still continues for a few days, then stops for a few days and then starts up again   History of kidney stones 1990   passed stone without surgery   Hypertension    Sciatica of right side    Takes gabapentin to help   Past Surgical History:  Procedure Laterality Date   COLONOSCOPY     VIDEO BRONCHOSCOPY WITH ENDOBRONCHIAL ULTRASOUND Right 08/30/2020   Procedure: VIDEO BRONCHOSCOPY WITH ENDOBRONCHIAL ULTRASOUND;  Surgeon: Flora Lipps, MD;  Location: [facility] ORS;  Service: Pulmonary;  Laterality: Right;   Family History  Problem Relation Age of Onset   Cervical cancer Mother    Heart disease Father    Lung cancer Sister    Social History   Socioeconomic History   Marital status: Married    Spouse name: Helene Kelp   Number of children: 2   Years of education: Not on file   Highest education level: Not on file  Occupational History   Occupation: Radiation protection practitioner area Copywriter, advertising    Comment: retired  Tobacco Use   Smoking status: Former    Packs/day: 1.50    Years: 46.00    Total pack years: 69.00    Types: Cigarettes    Quit date: 07/21/2020    Years since quitting: 1.3    Passive exposure: Past   Smokeless tobacco: Never  Vaping Use   Vaping Use: Never used  Substance and Sexual Activity   Alcohol use: Not Currently    Comment: rare- occasional beer   Drug use: Never   Sexual activity: Yes  Other Topics Concern   Not on file  Social History Narrative   Patient lives with his wife of 68 years.  Patient feels safe in his home.   Has a dog and a cat.   Social Determinants of Health   Financial Resource Strain: Low Risk  (11/14/2021)   Overall Financial Resource Strain (CARDIA)    Difficulty of Paying Living Expenses: Not hard at all  Food  Insecurity: No Food Insecurity (11/14/2021)   Hunger Vital Sign    Worried About Running Out of Food in the Last Year: Never true    Ran Out of Food in the Last Year: Never true  Transportation Needs: No Transportation Needs (11/14/2021)   PRAPARE - Hydrologist (Medical): No    Lack of Transportation (Non-Medical): No  Physical Activity: Inactive (11/14/2021)   Exercise Vital Sign    Days of Exercise per Week: 0 days    Minutes of Exercise per Session: 0 min  Stress: No Stress Concern Present (11/14/2021)   [place]    Feeling of Stress : Not  at all  Social Connections: Moderately Integrated (11/14/2021)   Social Connection and Isolation Panel [NHANES]    Frequency of Communication with Friends and Family: More than three times a week    Frequency of Social Gatherings with Friends and Family: Three times a week    Attends Religious Services: More than 4 times per year    Active Member of Clubs or Organizations: No    Attends Archivist Meetings: Never    Marital Status: Married    Tobacco Counseling Counseling given: Not Answered   Clinical Intake:  Pre-visit preparation completed: Yes  Pain : 0-10 Pain Score: 7  Pain Location: Arm Pain Orientation: Left Pain Descriptors / Indicators: Constant, Burning, Aching Pain Onset: More than a month ago Pain Relieving Factors: morphine tablet Effect of Pain on Daily Activities: yes  Pain Relieving Factors: morphine tablet  Diabetes: Yes CBG done?: No Did pt. bring in CBG monitor from home?: No  How often do you need to have someone help you when you read instructions, pamphlets, or other written materials from your doctor or pharmacy?: 1 - Never  Diabetic?  Yes  Nutrition Risk Assessment:  Has the patient had any N/V/D within the last 2 months?  No  Does the patient have any non-healing wounds?  No  Has the patient had  any unintentional weight loss or weight gain?  No   Diabetes:  Is the patient diabetic?  Yes  If diabetic, was a CBG obtained today?  No  Did the patient bring in their glucometer from home?  No  How often do you monitor your CBG's? 1 x a day.   Financial Strains and Diabetes Management:  Are you having any financial strains with the device, your supplies or your medication? No .  Does the patient want to be seen by Chronic Care Management for management of their diabetes?  No  Would the patient like to be referred to a Nutritionist or for Diabetic Management?  No   Diabetic Exams:  Diabetic Eye Exam: Overdue for diabetic eye exam. Pt has been advised about the importance in completing this exam.   Diabetic Foot Exam Pt has been advised about the importance in completing this exam  Interpreter Needed?: No  Information entered by :: Leroy Kennedy LPN   Activities of Daily Living    11/14/2021   10:50 AM  In your present state of health, do you have any difficulty performing the following activities:  Hearing? 0  Vision? 0  Difficulty concentrating or making decisions? 0  Walking or climbing stairs? 1  Dressing or bathing? 0  Doing errands, shopping? 1  Preparing Food and eating ? Y  Using the Toilet? N  Do you have problems with loss of bowel control? N  Managing your Medications? N  Managing your Finances? N  Housekeeping or managing your Housekeeping? Y    Patient Care Team: Jon Billings, NP as PCP - General (Nurse Practitioner) Telford Nab, RN as Oncology Nurse Navigator  Indicate any recent Medical Services you may have received from other than [facility] providers in the past year (date may be approximate).     Assessment:   This is a routine wellness examination for [place].  Hearing/Vision screen Hearing Screening - Comments:: No trouble hearing Vision Screening - Comments:: No up to date  Dietary issues and exercise activities discussed: Current Exercise  Habits: The patient does not participate in regular exercise at present, Exercise limited by: respiratory conditions(s)   Goals  Addressed             This Visit's Progress    Patient Stated       No goals       Depression Screen    11/14/2021   10:43 AM 09/27/2021    2:55 PM 08/16/2021    2:05 PM 08/26/2019    1:42 PM  PHQ 2/9 Scores  PHQ - 2 Score 1 0 0 0  PHQ- 9 Score 7 2 8 3     Fall Risk    11/14/2021   10:36 AM 09/27/2021    2:55 PM 08/16/2021    1:52 PM 08/26/2019    1:41 PM  Fall Risk   Falls in the past year? 0 0 0 0  Number falls in past yr: 0 0 0 0  Injury with Fall? 0 0 0 0  Risk for fall due to :  No Fall Risks  No Fall Risks  Follow up Falls evaluation completed;Education provided;Falls prevention discussed Falls evaluation completed Falls evaluation completed Falls evaluation completed    FALL RISK PREVENTION PERTAINING TO THE HOME:  Any stairs in or around the home? Yes  If so, are there any without handrails? No  Home free of loose throw rugs in walkways, pet beds, electrical cords, etc? Yes  Adequate lighting in your home to reduce risk of falls? Yes   ASSISTIVE DEVICES UTILIZED TO PREVENT FALLS:  Life alert? No  Use of a cane, walker or w/c? No  Grab bars in the bathroom? Yes  Shower chair or bench in shower? Yes  Elevated toilet seat or a handicapped toilet? Yes   TIMED UP AND GO:  Was the test performed? No .    Cognitive Function:        11/14/2021   10:38 AM  6CIT Screen  What Year? 0 points  What month? 0 points  What time? 0 points  Count back from 20 0 points  Months in reverse 0 points  Repeat phrase 2 points  Total Score 2 points    Immunizations Immunization History  Administered Date(s) Administered   Fluad Quad(high Dose 65+) 01/05/2021   Influenza Split 02/06/2005   Influenza,inj,Quad PF,6+ Mos 11/21/2015, 11/21/2016, 12/02/2017, 11/28/2018   Influenza-Unspecified 11/26/2012, 12/19/2014, 11/25/2018   PFIZER  Comirnaty(Gray Top)Covid-19 Tri-Sucrose Vaccine 05/12/2019, 06/06/2019   PFIZER(Purple Top)SARS-COV-2 Vaccination 05/12/2019, 06/06/2019   PNEUMOCOCCAL CONJUGATE-20 04/27/2021   Pneumococcal Polysaccharide-23 09/01/2014   Td 04/27/2021   Tdap 08/04/2004, 01/04/2011   Zoster Recombinat (Shingrix) 11/21/2016, 12/24/2017   Zoster, Live 12/28/2014    TDAP status: Up to date  Flu Vaccine status: Up to date  Pneumococcal vaccine status: Up to date  Covid-19 vaccine status: Information provided on how to obtain vaccines.   Qualifies for Shingles Vaccine? No   Zostavax completed Yes   Shingrix Completed?: Yes  Screening Tests Health Maintenance  Topic Date Due   FOOT EXAM  Never done   OPHTHALMOLOGY EXAM  Never done   Hepatitis C Screening  Never done   HEMOGLOBIN A1C  03/06/2019   COVID-19 Vaccine (5 - Pfizer risk series) 08/01/2019   Diabetic kidney evaluation - Urine ACR  09/16/2020   INFLUENZA VACCINE  10/02/2021   Diabetic kidney evaluation - GFR measurement  07/26/2022   COLONOSCOPY (Pts 45-28yrs Insurance coverage will need to be confirmed)  06/26/2027   TETANUS/TDAP  04/28/2031   Pneumonia Vaccine 89+ Years old  Completed   Zoster Vaccines- Shingrix  Completed   HPV VACCINES  Aged  Out    Health Maintenance  Health Maintenance Due  Topic Date Due   FOOT EXAM  Never done   OPHTHALMOLOGY EXAM  Never done   Hepatitis C Screening  Never done   HEMOGLOBIN A1C  03/06/2019   COVID-19 Vaccine (5 - Pfizer risk series) 08/01/2019   Diabetic kidney evaluation - Urine ACR  09/16/2020   INFLUENZA VACCINE  10/02/2021    Colorectal cancer screening: Type of screening: Colonoscopy. Completed 2019. Repeat every   years  Lung Cancer Screening: (Low Dose CT Chest recommended if Age 86-80 years, 30 pack-year currently smoking OR have quit w/in 15years.)   Lung Cancer Screening Referral:   Additional Screening:  Hepatitis C Screening: does not qualify; Completed 2017  Vision  Screening: Recommended annual ophthalmology exams for early detection of glaucoma and other disorders of the eye. Is the patient up to date with their annual eye exam?  No  Who is the provider or what is the name of the office in which the patient attends annual eye exams?  If pt is not established with a provider, would they like to be referred to a provider to establish care? No .   Dental Screening: Recommended annual dental exams for proper oral hygiene  Community Resource Referral / Chronic Care Management: CRR required this visit?  No   CCM required this visit?  No      Plan:     I have personally reviewed and noted the following in the patient's chart:   Medical and social history Use of alcohol, tobacco or illicit drugs  Current medications and supplements including opioid prescriptions. Patient is currently taking opioid prescriptions. Information provided to patient regarding non-opioid alternatives. Patient advised to discuss non-opioid treatment plan with their provider. Functional ability and status Nutritional status Physical activity Advanced directives List of other physicians Hospitalizations, surgeries, and ER visits in previous 12 months Vitals Screenings to include cognitive, depression, and falls Referrals and appointments  In addition, I have reviewed and discussed with patient certain preventive protocols, quality metrics, and best practice recommendations. A written personalized care plan for preventive services as well as general preventive health recommendations were provided to patient.     Leroy Kennedy, LPN   04/29/3333   Nurse Notes:

## 2021-12-28 ENCOUNTER — Encounter: Payer: Self-pay | Admitting: Nurse Practitioner

## 2021-12-28 ENCOUNTER — Ambulatory Visit (INDEPENDENT_AMBULATORY_CARE_PROVIDER_SITE_OTHER): Payer: Medicare Other | Admitting: Nurse Practitioner

## 2021-12-28 VITALS — BP 119/80 | HR 94 | Temp 98.4°F | Wt 151.8 lb

## 2021-12-28 DIAGNOSIS — E785 Hyperlipidemia, unspecified: Secondary | ICD-10-CM | POA: Diagnosis not present

## 2021-12-28 DIAGNOSIS — J449 Chronic obstructive pulmonary disease, unspecified: Secondary | ICD-10-CM

## 2021-12-28 DIAGNOSIS — I10 Essential (primary) hypertension: Secondary | ICD-10-CM | POA: Diagnosis not present

## 2021-12-28 DIAGNOSIS — E118 Type 2 diabetes mellitus with unspecified complications: Secondary | ICD-10-CM | POA: Diagnosis not present

## 2021-12-28 DIAGNOSIS — Z7189 Other specified counseling: Secondary | ICD-10-CM

## 2021-12-28 DIAGNOSIS — C3491 Malignant neoplasm of unspecified part of right bronchus or lung: Secondary | ICD-10-CM

## 2021-12-28 DIAGNOSIS — Z0000 Encounter for general adult medical examination without abnormal findings: Secondary | ICD-10-CM

## 2021-12-28 LAB — MICROALBUMIN, URINE WAIVED
Creatinine, Urine Waived: 50 mg/dL (ref 10–300)
Microalb, Ur Waived: 150 mg/L — ABNORMAL HIGH (ref 0–19)
Microalb/Creat Ratio: >300 mg/g — ABNORMAL HIGH (ref <30)

## 2021-12-28 NOTE — Progress Notes (Signed)
BP 119/80   Pulse 94   Temp 98.4 F (36.9 C) (Oral)   Wt 151 lb 12.8 oz (68.9 kg)   SpO2 98%   BMI 21.90 kg/m    Subjective:    Patient ID: Bradley Bruce, male    DOB: January 08, 1956, 66 y.o.   MRN: [id number]  HPI: Bradley Bruce is a 66 y.o. male  Chief Complaint  Patient presents with   Hypertension   Hyperlipidemia   Diabetes   HYPERTENSION / [place] Satisfied with current treatment? yes HTN medications: not taking any medication Duration of hypertension: chronic BP monitoring frequency: daily BP range: 117/80s  BP medication side effects: yes Duration of hyperlipidemia: chronic Cholesterol medication side effects: no Cholesterol supplements: none HLD medications: Rosuvastatin 20 mg PO QD Medication compliance: excellent compliance Aspirin: no Recent stressors: yes Recurrent headaches: no Visual changes: no Palpitations: no Dyspnea: yes Chest pain: no Lower extremity edema: no Dizzy/lightheaded: no     SHORTNESS OF BREATH- related to ongoing lung cancer  Duration: 4-6 months  Onset: gradual Description of breathing discomfort:  Severity: severe Related to exertion: yes Cough: no Chest tightness: no Wheezing: yes Fevers: no Chest pain: yes, along right side  Palpitations: no  Nausea: yes Diaphoresis: no Deconditioning: yes Status: fluctuating,  Aggravating factors: exertion, going up stairs.  Alleviating factors: rest Treatments attempted: He is taking Spiriva, albuterol, and Advair - states he doesn't notice much of a difference but Albuterol seems to improve chest tightness and pain   Relevant past medical, surgical, family and social history reviewed and updated as indicated. Interim medical history since our last visit reviewed. Allergies and medications reviewed and updated.  Review of Systems  Eyes:  Negative for visual disturbance.  Respiratory:  Positive for shortness of breath. Negative for chest tightness.   Cardiovascular:  Negative  for chest pain, palpitations and leg swelling.  Neurological:  Negative for dizziness, light-headedness and headaches.    Per HPI unless specifically indicated above     Objective:    BP 119/80   Pulse 94   Temp 98.4 F (36.9 C) (Oral)   Wt 151 lb 12.8 oz (68.9 kg)   SpO2 98%   BMI 21.90 kg/m   Wt Readings from Last 3 Encounters:  12/28/21 151 lb 12.8 oz (68.9 kg)  09/27/21 168 lb (76.2 kg)  08/16/21 173 lb 3.2 oz (78.6 kg)    Physical Exam Vitals and nursing note reviewed.  Constitutional:      General: He is not in acute distress.    Appearance: Normal appearance. He is not ill-appearing, toxic-appearing or diaphoretic.  HENT:     Head: Normocephalic.     Right Ear: External ear normal.     Left Ear: External ear normal.     Nose: Nose normal. No congestion or rhinorrhea.     Mouth/Throat:     Mouth: Mucous membranes are moist.  Eyes:     General:        Right eye: No discharge.        Left eye: No discharge.     Extraocular Movements: Extraocular movements intact.     Conjunctiva/sclera: Conjunctivae normal.     Pupils: Pupils are equal, round, and reactive to light.  Cardiovascular:     Rate and Rhythm: Normal rate and regular rhythm.     Heart sounds: No murmur heard. Pulmonary:     Effort: Pulmonary effort is normal. No respiratory distress.     Breath sounds: Wheezing present. No  rhonchi or rales.  Abdominal:     General: Abdomen is flat. Bowel sounds are normal.  Musculoskeletal:     Cervical back: Normal range of motion and neck supple.  Skin:    General: Skin is warm and dry.     Capillary Refill: Capillary refill takes less than 2 seconds.  Neurological:     General: No focal deficit present.     Mental Status: He is alert and oriented to person, place, and time.  Psychiatric:        Mood and Affect: Mood normal.        Behavior: Behavior normal.        Thought Content: Thought content normal.        Judgment: Judgment normal.     Results  for orders placed or performed during the hospital encounter of 45/80/99  Basic metabolic panel  Result Value Ref Range   Sodium 131 (L) 135 - 145 mmol/L   Potassium 3.5 3.5 - 5.1 mmol/L   Chloride 96 (L) 98 - 111 mmol/L   CO2 25 22 - 32 mmol/L   Glucose, Bld 204 (H) 70 - 99 mg/dL   BUN 15 8 - 23 mg/dL   Creatinine, Ser 0.76 0.61 - 1.24 mg/dL   Calcium 9.3 8.9 - 10.3 mg/dL   GFR, Estimated >60 >60 mL/min   Anion gap 10 5 - 15  CBC  Result Value Ref Range   WBC 8.8 4.0 - 10.5 K/uL   RBC 4.75 4.22 - 5.81 MIL/uL   Hemoglobin 13.4 13.0 - 17.0 g/dL   HCT 39.6 39.0 - 52.0 %   MCV 83.4 80.0 - 100.0 fL   MCH 28.2 26.0 - 34.0 pg   MCHC 33.8 30.0 - 36.0 g/dL   RDW 13.0 11.5 - 15.5 %   Platelets 296 150 - 400 K/uL   nRBC 0.0 0.0 - 0.2 %  Troponin I (High Sensitivity)  Result Value Ref Range   Troponin I (High Sensitivity) 3 <18 ng/L  Troponin I (High Sensitivity)  Result Value Ref Range   Troponin I (High Sensitivity) 4 <18 ng/L      Assessment & Plan:   Problem List Items Addressed This Visit       Cardiovascular and Mediastinum   Hypertension - Primary    Chronic.  Controlled without medication.  Labs ordered today. Return to clinic in 6 months for reevaluation.  Call sooner if concerns arise.        Relevant Orders   Comp Met (CMET)     Respiratory   COPD (chronic obstructive pulmonary disease) (HCC)    Chronic, ongoing Currently managed with Spiriva, Albuterol and Advair - appears partially controlled at this time.  Likely exacerbated by lung cancer Dx He is followed by pulmonology and appears to be managed by them for both dx Continue with current medications  Follow up in 3 months.  Call sooner if concerns arise.       Squamous cell carcinoma lung, right (HCC)    Chronic. Given 4 months to live.  Still receiving chemo and [facility] at [facility].         Endocrine   Controlled type 2 diabetes mellitus with complication, without long-term current use of insulin  (HCC)    Chronic.  Controlled.  Continue with current medication regimen.  A1c is well controlled at 7.0.  Labs ordered today.   Return to clinic in 6 months for reevaluation.  Call sooner if concerns arise.  Relevant Orders   Urine Microalbumin w/creat. ratio   HgB A1c     Other   Hyperlipidemia, unspecified    Chronic.  Controlled.  Continue with current medication regimen of Crestor 62m.  Labs ordered today.  Return to clinic in 6 months for reevaluation.  Call sooner if concerns arise.        Relevant Orders   Lipid Profile   Other Visit Diagnoses     Encounter for annual wellness exam in Medicare patient       Relevant Orders   Urine Microalbumin w/creat. ratio   HgB A1c   Comp Met (CMET)   Lipid Profile   Advanced care planning/counseling discussion            Follow up plan: Return in about 6 months (around 06/29/2022) for Physical and Fasting labs.

## 2021-12-28 NOTE — Assessment & Plan Note (Signed)
Chronic.  Controlled.  Continue with current medication regimen of Crestor 20mg .  Labs ordered today.  Return to clinic in 6 months for reevaluation.  Call sooner if concerns arise.

## 2021-12-28 NOTE — Assessment & Plan Note (Signed)
Chronic.  Controlled without medication..  Labs ordered today.  Return to clinic in 6 months for reevaluation.  Call sooner if concerns arise.  ° °

## 2021-12-28 NOTE — Assessment & Plan Note (Signed)
Chronic, ongoing Currently managed with Spiriva, Albuterol and Advair - appears partially controlled at this time.  Likely exacerbated by lung cancer Dx He is followed by pulmonology and appears to be managed by them for both dx Continue with current medications  Follow up in 3 months.  Call sooner if concerns arise.

## 2021-12-28 NOTE — Progress Notes (Deleted)
There were no vitals taken for this visit.   Subjective:    Patient ID: Bradley Bruce, male    DOB: 08-17-1955, 66 y.o.   MRN: [id number]  HPI: Bradley Bruce is a 66 y.o. male presenting on 12/28/2021 for comprehensive medical examination. Current medical complaints include:{Blank single:19197::"none","***"}  He currently lives with: Interim Problems from his last visit: {Blank single:19197::"yes","no"}  HYPERTENSION / HYPERLIPIDEMIA Satisfied with current treatment? yes HTN medications: Lotensin 20-25mg ,  Duration of hypertension: chronic BP monitoring frequency: daily BP range: 117/80s  BP medication side effects: yes Duration of hyperlipidemia: chronic Cholesterol medication side effects: no Cholesterol supplements: none HLD medications: Rosuvastatin 20 mg PO QD Medication compliance: excellent compliance Aspirin: no Recent stressors: yes Recurrent headaches: no Visual changes: no Palpitations: no Dyspnea: yes Chest pain: no Lower extremity edema: no Dizzy/lightheaded: no     SHORTNESS OF BREATH- related to ongoing lung cancer  Duration: 4-6 months  Onset: gradual Description of breathing discomfort:  Severity: severe Related to exertion: yes Cough: no Chest tightness: no Wheezing: yes Fevers: no Chest pain: yes, along right side  Palpitations: no  Nausea: yes Diaphoresis: no Deconditioning: yes Status: fluctuating,  Aggravating factors: exertion, going up stairs.  Alleviating factors: rest Treatments attempted: He is taking Spiriva, albuterol, and Advair - states he doesn't notice much of a difference but Albuterol seems to improve chest tightness and pain  Depression Screen done today and results listed below:     11/14/2021   10:43 AM 09/27/2021    2:55 PM 08/16/2021    2:05 PM 08/26/2019    1:42 PM  Depression screen PHQ 2/9  Decreased Interest 1 0 0 0  Down, Depressed, Hopeless 0 0 0 0  PHQ - 2 Score 1 0 0 0  Altered sleeping 0 0 3 3  Tired, decreased  energy 3 2 3  0  Change in appetite 3 0 2 0  Feeling bad or failure about yourself  0 0 0 0  Trouble concentrating 0 0 0 0  Moving slowly or fidgety/restless 0 0 0 0  Suicidal thoughts 0 0 0 0  PHQ-9 Score 7 2 8 3   Difficult doing work/chores Not difficult at all Not difficult at all Somewhat difficult Not difficult at all    The patient {has/does not have:19849} a history of falls. I {did/did not:19850} complete a risk assessment for falls. A plan of care for falls {was/was not:19852} documented.   Past Medical History:  Past Medical History:  Diagnosis Date   Allergy    Asthma    Cancer ([place]) 08/2020   lung mass probably metastatic   Chronic cough 08/25/2020   COPD (chronic obstructive pulmonary disease) ([place])    Diabetes mellitus without complication ([place])    Dyspnea 08/2020   worsening now   ED (erectile dysfunction)    Fatigue 08/25/2020   Hemoptysis 08/25/2020   still continues for a few days, then stops for a few days and then starts up again   History of kidney stones 1990   passed stone without surgery   Hypertension    Sciatica of right side    Takes gabapentin to help    Surgical History:  Past Surgical History:  Procedure Laterality Date   COLONOSCOPY     VIDEO BRONCHOSCOPY WITH ENDOBRONCHIAL ULTRASOUND Right 08/30/2020   Procedure: VIDEO BRONCHOSCOPY WITH ENDOBRONCHIAL ULTRASOUND;  Surgeon: Flora Lipps, MD;  Location: [facility] ORS;  Service: Pulmonary;  Laterality: Right;    Medications:  Current Outpatient Medications on  File Prior to Visit  Medication Sig   albuterol (VENTOLIN HFA) 108 (90 Base) MCG/ACT inhaler Inhale 2 puffs into the lungs every 6 (six) hours as needed for shortness of breath or wheezing.   benazepril-hydrochlorthiazide (LOTENSIN HCT) 20-25 MG tablet Take 1 tablet by mouth daily.   benzonatate (TESSALON) 100 MG capsule Take 2 capsules (200 mg total) by mouth every 8 (eight) hours. (Patient not taking: Reported on 11/14/2021)    Camphor-Eucalyptus-Menthol (VICKS VAPORUB EX) Apply 1 application topically at bedtime.   cetirizine (ZYRTEC) 10 MG tablet Take by mouth.   durvalumab 10 mg/kg in sodium chloride 0.9 % 100 mL Inject 10 mg/kg into the vein once.   fluticasone-salmeterol (ADVAIR DISKUS) 250-50 MCG/ACT AEPB Inhale 1 puff into the lungs in the morning and at bedtime.   ibuprofen (ADVIL) 600 MG tablet Take 1 tablet (600 mg total) by mouth in the morning and at bedtime. (Patient not taking: Reported on 11/14/2021)   KLOR-CON M20 20 MEQ tablet Take 20 mEq by mouth daily.   metFORMIN (GLUCOPHAGE-XR) 750 MG 24 hr tablet Take 750 mg by mouth at bedtime.   morphine (MSIR) 15 MG tablet Take 15 mg by mouth every 4 (four) hours as needed for severe pain.   omeprazole (PRILOSEC) 40 MG capsule Take 40 mg by mouth daily.   Potassium Chloride ER 20 MEQ TBCR Take 1 tablet by mouth daily.   prochlorperazine (COMPAZINE) 10 MG tablet Take 10 mg by mouth every 6 (six) hours as needed. (Patient not taking: Reported on 11/14/2021)   promethazine-dextromethorphan (PROMETHAZINE-DM) 6.25-15 MG/5ML syrup Take 5 mLs by mouth 4 (four) times daily as needed. (Patient not taking: Reported on 11/14/2021)   rosuvastatin (CRESTOR) 20 MG tablet Take 20 mg by mouth daily.   Spacer/Aero-Holding Chambers (AEROCHAMBER MV) inhaler Use as instructed   SPIRIVA RESPIMAT 2.5 MCG/ACT AERS SMARTSIG:2 Puff(s) Via Inhaler Daily   No current facility-administered medications on file prior to visit.    Allergies:  No Known Allergies  Social History:  Social History   Socioeconomic History   Marital status: Married    Spouse name: Helene Kelp   Number of children: 2   Years of education: Not on file   Highest education level: Not on file  Occupational History   Occupation: Radiation protection practitioner area Copywriter, advertising    Comment: retired  Tobacco Use   Smoking status: Former    Packs/day: 1.50    Years: 46.00    Total pack years: 69.00    Types: Cigarettes    Quit date:  07/21/2020    Years since quitting: 1.4    Passive exposure: Past   Smokeless tobacco: Never  Vaping Use   Vaping Use: Never used  Substance and Sexual Activity   Alcohol use: Not Currently    Comment: rare- occasional beer   Drug use: Never   Sexual activity: Yes  Other Topics Concern   Not on file  Social History Narrative   Patient lives with his wife of 42 years.  Patient feels safe in his home.   Has a dog and a cat.   Social Determinants of Health   Financial Resource Strain: Low Risk  (11/14/2021)   Overall Financial Resource Strain (CARDIA)    Difficulty of Paying Living Expenses: Not hard at all  Food Insecurity: No Food Insecurity (11/14/2021)   Hunger Vital Sign    Worried About Running Out of Food in the Last Year: Never true    Ran Out of Food in the  Last Year: Never true  Transportation Needs: No Transportation Needs (11/14/2021)   PRAPARE - Hydrologist (Medical): No    Lack of Transportation (Non-Medical): No  Physical Activity: Inactive (11/14/2021)   Exercise Vital Sign    Days of Exercise per Week: 0 days    Minutes of Exercise per Session: 0 min  Stress: No Stress Concern Present (11/14/2021)   [place]    Feeling of Stress : Not at all  Social Connections: Moderately Integrated (11/14/2021)   Social Connection and Isolation Panel [NHANES]    Frequency of Communication with Friends and Family: More than three times a week    Frequency of Social Gatherings with Friends and Family: Three times a week    Attends Religious Services: More than 4 times per year    Active Member of Clubs or Organizations: No    Attends Archivist Meetings: Never    Marital Status: Married  Human resources officer Violence: Not At Risk (11/14/2021)   Humiliation, Afraid, Rape, and Kick questionnaire    Fear of Current or Ex-Partner: No    Emotionally Abused: No    Physically Abused:  No    Sexually Abused: No   Social History   Tobacco Use  Smoking Status Former   Packs/day: 1.50   Years: 46.00   Total pack years: 69.00   Types: Cigarettes   Quit date: 07/21/2020   Years since quitting: 1.4   Passive exposure: Past  Smokeless Tobacco Never   Social History   Substance and Sexual Activity  Alcohol Use Not Currently   Comment: rare- occasional beer    Family History:  Family History  Problem Relation Age of Onset   Cervical cancer Mother    Heart disease Father    Lung cancer Sister     Past medical history, surgical history, medications, allergies, family history and social history reviewed with patient today and changes made to appropriate areas of the chart.   ROS All other ROS negative except what is listed above and in the HPI.      Objective:    There were no vitals taken for this visit.  Wt Readings from Last 3 Encounters:  09/27/21 168 lb (76.2 kg)  08/16/21 173 lb 3.2 oz (78.6 kg)  07/25/21 178 lb (80.7 kg)    Physical Exam  Results for orders placed or performed during the hospital encounter of 53/97/67  Basic metabolic panel  Result Value Ref Range   Sodium 131 (L) 135 - 145 mmol/L   Potassium 3.5 3.5 - 5.1 mmol/L   Chloride 96 (L) 98 - 111 mmol/L   CO2 25 22 - 32 mmol/L   Glucose, Bld 204 (H) 70 - 99 mg/dL   BUN 15 8 - 23 mg/dL   Creatinine, Ser 0.76 0.61 - 1.24 mg/dL   Calcium 9.3 8.9 - 10.3 mg/dL   GFR, Estimated >60 >60 mL/min   Anion gap 10 5 - 15  CBC  Result Value Ref Range   WBC 8.8 4.0 - 10.5 K/uL   RBC 4.75 4.22 - 5.81 MIL/uL   Hemoglobin 13.4 13.0 - 17.0 g/dL   HCT 39.6 39.0 - 52.0 %   MCV 83.4 80.0 - 100.0 fL   MCH 28.2 26.0 - 34.0 pg   MCHC 33.8 30.0 - 36.0 g/dL   RDW 13.0 11.5 - 15.5 %   Platelets 296 150 - 400 K/uL   nRBC  0.0 0.0 - 0.2 %  Troponin I (High Sensitivity)  Result Value Ref Range   Troponin I (High Sensitivity) 3 <18 ng/L  Troponin I (High Sensitivity)  Result Value Ref Range    Troponin I (High Sensitivity) 4 <18 ng/L      Assessment & Plan:   Problem List Items Addressed This Visit   None    Discussed aspirin prophylaxis for myocardial infarction prevention and decision was {Blank single:19197::"it was not indicated","made to continue ASA","made to start ASA","made to stop ASA","that we recommended ASA, and patient refused"}  LABORATORY TESTING:  Health maintenance labs ordered today as discussed above.   The natural history of prostate cancer and ongoing controversy regarding screening and potential treatment outcomes of prostate cancer has been discussed with the patient. The meaning of a false positive PSA and a false negative PSA has been discussed. He indicates understanding of the limitations of this screening test and wishes *** to proceed with screening PSA testing.   IMMUNIZATIONS:   - Tdap: Tetanus vaccination status reviewed: {tetanus status:315746}. - Influenza: {Blank single:19197::"Up to date","Administered today","Postponed to flu season","Refused","Given elsewhere"} - Pneumovax: {Blank single:19197::"Up to date","Administered today","Not applicable","Refused","Given elsewhere"} - Prevnar: {Blank single:19197::"Up to date","Administered today","Not applicable","Refused","Given elsewhere"} - COVID: {Blank single:19197::"Up to date","Administered today","Not applicable","Refused","Given elsewhere"} - HPV: {Blank single:19197::"Up to date","Administered today","Not applicable","Refused","Given elsewhere"} - Shingrix vaccine: {Blank single:19197::"Up to date","Administered today","Not applicable","Refused","Given elsewhere"}  SCREENING: - Colonoscopy: {Blank single:19197::"Up to date","Ordered today","Not applicable","Refused","Done elsewhere"}  Discussed with patient purpose of the colonoscopy is to detect colon cancer at curable precancerous or early stages   - AAA Screening: {Blank single:19197::"Up to date","Ordered today","Not  applicable","Refused","Done elsewhere"}  -Hearing Test: {Blank single:19197::"Up to date","Ordered today","Not applicable","Refused","Done elsewhere"}  -Spirometry: {Blank single:19197::"Up to date","Ordered today","Not applicable","Refused","Done elsewhere"}   PATIENT COUNSELING:    Sexuality: Discussed sexually transmitted diseases, partner selection, use of condoms, avoidance of unintended pregnancy  and contraceptive alternatives.   Advised to avoid cigarette smoking.  I discussed with the patient that most people either abstain from alcohol or drink within safe limits (<=14/week and <=4 drinks/occasion for males, <=7/weeks and <= 3 drinks/occasion for females) and that the risk for alcohol disorders and other health effects rises proportionally with the number of drinks per week and how often a drinker exceeds daily limits.  Discussed cessation/primary prevention of drug use and availability of treatment for abuse.   Diet: Encouraged to adjust caloric intake to maintain  or achieve ideal body weight, to reduce intake of dietary saturated fat and total fat, to limit sodium intake by avoiding high sodium foods and not adding table salt, and to maintain adequate dietary potassium and calcium preferably from fresh fruits, vegetables, and low-fat dairy products.    stressed the importance of regular exercise  Injury prevention: Discussed safety belts, safety helmets, smoke detector, smoking near bedding or upholstery.   Dental health: Discussed importance of regular tooth brushing, flossing, and dental visits.   Follow up plan: NEXT PREVENTATIVE PHYSICAL DUE IN 1 YEAR. No follow-ups on file.

## 2021-12-28 NOTE — Assessment & Plan Note (Signed)
Chronic.  Controlled.  Continue with current medication regimen.  A1c is well controlled at 7.0.  Labs ordered today.   Return to clinic in 6 months for reevaluation.  Call sooner if concerns arise.

## 2021-12-28 NOTE — Assessment & Plan Note (Signed)
Chronic. Given 4 months to live.  Still receiving chemo and radiation at [facility].

## 2021-12-29 LAB — COMPREHENSIVE METABOLIC PANEL
ALT: 39 IU/L (ref 0–44)
AST: 29 IU/L (ref 0–40)
Albumin/Globulin Ratio: 1.1 — ABNORMAL LOW (ref 1.2–2.2)
Albumin: 4.4 g/dL (ref 3.9–4.9)
Alkaline Phosphatase: 113 IU/L (ref 44–121)
BUN/Creatinine Ratio: 34 — ABNORMAL HIGH (ref 10–24)
BUN: 31 mg/dL — ABNORMAL HIGH (ref 8–27)
Bilirubin Total: 0.4 mg/dL (ref 0.0–1.2)
CO2: 25 mmol/L (ref 20–29)
Calcium: 9.5 mg/dL (ref 8.6–10.2)
Chloride: 91 mmol/L — ABNORMAL LOW (ref 96–106)
Creatinine, Ser: 0.91 mg/dL (ref 0.76–1.27)
Globulin, Total: 4 g/dL (ref 1.5–4.5)
Glucose: 117 mg/dL — ABNORMAL HIGH (ref 70–99)
Potassium: 3.2 mmol/L — ABNORMAL LOW (ref 3.5–5.2)
Sodium: 135 mmol/L (ref 134–144)
Total Protein: 8.4 g/dL (ref 6.0–8.5)
eGFR: 92 mL/min/{1.73_m2} (ref >59)

## 2021-12-29 LAB — LIPID PANEL
Chol/HDL Ratio: 3.7 ratio (ref 0.0–5.0)
Cholesterol, Total: 173 mg/dL (ref 100–199)
HDL: 47 mg/dL (ref >39)
LDL Chol Calc (NIH): 108 mg/dL — ABNORMAL HIGH (ref 0–99)
Triglycerides: 101 mg/dL (ref 0–149)
VLDL Cholesterol Cal: 18 mg/dL (ref 5–40)

## 2021-12-29 LAB — HEMOGLOBIN A1C
Est. average glucose Bld gHb Est-mCnc: 166 mg/dL
Hgb A1c MFr Bld: 7.4 % — ABNORMAL HIGH (ref 4.8–5.6)

## 2021-12-31 NOTE — Progress Notes (Signed)
Please let patient his A1c came back at 7.4.  We will keep an eye on this at future visits.  His potassium was low.  Please make sure he has follow up scheduled with his hematologist to have his blood work repeated.  Make sure to let them know his potassium was low.

## 2022-01-04 ENCOUNTER — Encounter: Payer: Self-pay | Admitting: Nurse Practitioner

## 2022-03-07 ENCOUNTER — Other Ambulatory Visit
Admission: RE | Admit: 2022-03-07 | Discharge: 2022-03-07 | Disposition: A | Discharge status: Home / Self Care | Payer: Medicare Other | Attending: Oncology | Admitting: Oncology

## 2022-03-07 DIAGNOSIS — Z006 Encounter for examination for normal comparison and control in clinical research program: Secondary | ICD-10-CM | POA: Insufficient documentation

## 2022-03-08 LAB — HEMOGLOBIN A1C
Hgb A1c MFr Bld: 6.6 % — ABNORMAL HIGH (ref 4.8–5.6)
Mean Plasma Glucose: 143 mg/dL

## 2022-04-21 ENCOUNTER — Encounter: Payer: Self-pay | Admitting: Intensive Care

## 2022-04-21 ENCOUNTER — Emergency Department: Payer: Medicare Other

## 2022-04-21 ENCOUNTER — Emergency Department
Admission: EM | Admit: 2022-04-21 | Discharge: 2022-04-21 | Disposition: A | Discharge status: Home / Self Care | Payer: Medicare Other | Attending: Emergency Medicine | Admitting: Emergency Medicine

## 2022-04-21 ENCOUNTER — Other Ambulatory Visit: Payer: Self-pay

## 2022-04-21 DIAGNOSIS — R0602 Shortness of breath: Secondary | ICD-10-CM | POA: Diagnosis present

## 2022-04-21 DIAGNOSIS — R0600 Dyspnea, unspecified: Secondary | ICD-10-CM | POA: Diagnosis not present

## 2022-04-21 DIAGNOSIS — J449 Chronic obstructive pulmonary disease, unspecified: Secondary | ICD-10-CM | POA: Insufficient documentation

## 2022-04-21 DIAGNOSIS — E119 Type 2 diabetes mellitus without complications: Secondary | ICD-10-CM | POA: Diagnosis not present

## 2022-04-21 DIAGNOSIS — I10 Essential (primary) hypertension: Secondary | ICD-10-CM | POA: Diagnosis not present

## 2022-04-21 DIAGNOSIS — J45909 Unspecified asthma, uncomplicated: Secondary | ICD-10-CM | POA: Diagnosis not present

## 2022-04-21 LAB — BASIC METABOLIC PANEL
Anion gap: 14 (ref 5–15)
BUN: 10 mg/dL (ref 8–23)
CO2: 25 mmol/L (ref 22–32)
Calcium: 9.2 mg/dL (ref 8.9–10.3)
Chloride: 88 mmol/L — ABNORMAL LOW (ref 98–111)
Creatinine, Ser: 0.54 mg/dL — ABNORMAL LOW (ref 0.61–1.24)
GFR, Estimated: >60 mL/min (ref >60)
Glucose, Bld: 140 mg/dL — ABNORMAL HIGH (ref 70–99)
Potassium: 3.5 mmol/L (ref 3.5–5.1)
Sodium: 127 mmol/L — ABNORMAL LOW (ref 135–145)

## 2022-04-21 LAB — CBC
HCT: 28.5 % — ABNORMAL LOW (ref 39.0–52.0)
Hemoglobin: 8.8 g/dL — ABNORMAL LOW (ref 13.0–17.0)
MCH: 26.5 pg (ref 26.0–34.0)
MCHC: 30.9 g/dL (ref 30.0–36.0)
MCV: 85.8 fL (ref 80.0–100.0)
Platelets: 356 10*3/uL (ref 150–400)
RBC: 3.32 MIL/uL — ABNORMAL LOW (ref 4.22–5.81)
RDW: 15.6 % — ABNORMAL HIGH (ref 11.5–15.5)
WBC: 8.5 10*3/uL (ref 4.0–10.5)
nRBC: 0 % (ref 0.0–0.2)

## 2022-04-21 LAB — TROPONIN I (HIGH SENSITIVITY): Troponin I (High Sensitivity): 3 ng/L (ref <18)

## 2022-04-21 LAB — LACTIC ACID, PLASMA: Lactic Acid, Venous: 2.5 mmol/L (ref 0.5–1.9)

## 2022-04-21 MED ORDER — SODIUM CHLORIDE 0.9 % IV BOLUS
1000.0000 mL | Freq: Once | INTRAVENOUS | Status: AC
  Administered 2022-04-21: 1000 mL via INTRAVENOUS

## 2022-04-21 MED ORDER — IPRATROPIUM-ALBUTEROL 0.5-2.5 (3) MG/3ML IN SOLN
3.0000 mL | Freq: Once | RESPIRATORY_TRACT | Status: AC
  Administered 2022-04-21: 3 mL via RESPIRATORY_TRACT
  Filled 2022-04-21: qty 3

## 2022-04-21 MED ORDER — IPRATROPIUM-ALBUTEROL 0.5-2.5 (3) MG/3ML IN SOLN: 3.0000 mL | RESPIRATORY_TRACT | 5 refills | Status: DC | PRN

## 2022-04-21 NOTE — Discharge Instructions (Addendum)
Please follow-up with your doctor tomorrow as scheduled.  Please use your nebulizer treatments as needed for shortness of breath, as prescribed.  As we discussed return immediately to the emergency department for any chest pain worsening shortness of breath fever or any other symptom personally concerning to yourself.

## 2022-04-21 NOTE — ED Triage Notes (Signed)
Patient has lung cancer and currently taking new trial medication. Reports worsening SOB, chest pressure, and lightheadedness over the last few days.

## 2022-04-21 NOTE — ED Provider Notes (Signed)
[facility] Provider Note    Event Date/Time   First MD Initiated Contact with Patient 04/21/22 1551     (approximate)  History   Chief Complaint: Chest Pain and Shortness of Breath  HPI  Bradley Bruce is a 67 y.o. male with a past medical history of asthma, COPD, diabetes, hypertension, lung cancer, presents to the emergency department for worsening shortness of breath.  Patient follows up with [facility] oncology has a follow-up appointment tomorrow for a CT scan of the chest abdomen pelvis per patient and family.  Here patient appears well he is satting 97 to 99% on room air.  No baseline O2 requirement.  Patient does have occasional cough as well as occasional inspiratory stridor mostly with exertion.  Patient has a history of lung cancer currently on 2 immunotherapy agents.  Patient has a right bronchial stent.  Patient is speaking in 3-4 word sentences.  Tried his saline nebulizer at home without relief so he came to the emergency department for evaluation.  Denies any chest pain or any increased discomfort.  Physical Exam   Triage Vital Signs: ED Triage Vitals  Enc Vitals Group     BP 04/21/22 1529 (!) 125/55     Pulse Rate 04/21/22 1529 66     Resp 04/21/22 1529 (!) 26     Temp 04/21/22 1533 97.6 F (36.4 C)     Temp Source 04/21/22 1533 Oral     SpO2 04/21/22 1529 100 %     Weight 04/21/22 1529 130 lb (59 kg)     Height 04/21/22 1529 5\' 10"  (1.778 m)     Head Circumference --      Peak Flow --      Pain Score 04/21/22 1529 5     Pain Loc --      Pain Edu? --      Excl. in [place]? --     Most recent vital signs: Vitals:   04/21/22 1533 04/21/22 1605  BP:  116/75  Pulse:  (!) 111  Resp:  (!) 25  Temp: 97.6 F (36.4 C)   SpO2:  97%    General: Awake, no distress.  CV:  Good peripheral perfusion.  Regular rate and rhythm  Resp:  Normal effort.  Diminished breath sounds on the right lung fields.  Occasional cough during exam Abd:  No distention.   Soft, nontender.     ED Results / Procedures / Treatments   EKG  EKG viewed and interpreted by myself shows sinus tachycardia 119 bpm with a narrow QRS, normal axis, normal intervals, nonspecific ST changes.  RADIOLOGY  I have reviewed and interpreted the chest x-ray images.  Patient appears to have significant consolidation and lung collapse of the right hemithorax.  Patient's family was able to pull up his last chest x-ray from December 21 at [facility] on their phone.  I was able to review this image today's image does appear worse as far as consolidation. Radiology has read the x-ray as progressive masslike consolidation throughout the right lung with associated volume loss findings are highly concerning for progression of known lung cancer, superimposed infection or postobstructive changes cannot be excluded.  Stable right mainstem bronchial stent   MEDICATIONS ORDERED IN ED: Medications  sodium chloride 0.9 % bolus 1,000 mL (has no administration in time range)  ipratropium-albuterol (DUONEB) 0.5-2.5 (3) MG/3ML nebulizer solution 3 mL (3 mLs Nebulization Given 04/21/22 1615)  ipratropium-albuterol (DUONEB) 0.5-2.5 (3) MG/3ML nebulizer solution 3 mL (3  mLs Nebulization Given 04/21/22 1615)  ipratropium-albuterol (DUONEB) 0.5-2.5 (3) MG/3ML nebulizer solution 3 mL (3 mLs Nebulization Given 04/21/22 1615)     IMPRESSION / MDM / ASSESSMENT AND PLAN / ED COURSE  I reviewed the triage vital signs and the nursing notes.  Patient's presentation is most consistent with acute presentation with potential threat to life or bodily function.  Patient presents to the emergency department for worsening shortness of breath over the past 1 week or so.  Has been using saline nebulizers at home without much relief.  Currently satting 97% while in bed on room air.  No baseline O2 requirement.  Patient's chest x-ray does appear to show significant consolidation likely collapse, likely indicating progression of  known right lung cancer.  Patient has a follow-up tomorrow at [facility] for CT imaging of the chest abdomen and pelvis.  I discussed with the patient and family we could proceed with CT imaging now, they prefer to hold off at the moment they were hoping the patient could receive some type of medication or nebulizer to help him breathe better and then they could follow-up with [facility] tomorrow.  Patient is mildly tachycardic, we will dose IV fluids we will dose 3 DuoNebs we will check labs and continue to closely monitor.  I told the patient we will start with the lab work and then we will reassess afterwards and if the patient wishes to proceed with CT imaging we could definitely do that here if the patient wishes to wait until he follows up with [facility] tomorrow believe that would be reasonable as well as long as the patient is feeling better.  Patient's workup shows mild hyponatremia at 127 however I reviewed the patient's care everywhere chart and his sodium levels have been ranging between 125 in the low 130s.  Patient has received a liter of IV normal saline.  Patient's CBC shows anemia with a hemoglobin of 8.8 which is slightly increased from recent Care Everywhere lab values 8.6.  Patient's troponin is reassuringly negative.  Lactic acid is mildly elevated at 2.5 however again the patient is receiving IV fluids, reassuringly is afebrile with a normal white blood cell count.  Patient states he is feeling much better after nebulizer treatments and fluids.  Patient remains tachycardic around 110 bpm during my evaluation.  I did discuss with the patient and family that overall reassuring workup at his recent baseline per care everywhere however the only way to rule out a pulmonary embolism would be to proceed with a CTA of the chest.  Patient's renal function can tolerate the CTA well.  However patient states he feels better and is ready to go home has an appointment with his oncologist tomorrow for CT scans of the chest  abdomen pelvis and will discuss this further with them.  Given the patient's overall reassuring workup I believe the patient will be safe for discharge home with close follow-up tomorrow at noon as scheduled.  I discussed very strict return precautions should the patient develop any worsening shortness of breath or fever he is to return to the emergency department immediately.  I will prescribe the patient DuoNebs to be used at home if needed.  FINAL CLINICAL IMPRESSION(S) / ED DIAGNOSES   Dyspnea  Note:  This document was prepared using Dragon voice recognition software and may include unintentional dictation errors.   Harvest Dark, MD 04/21/22 [phone]

## 2022-04-22 ENCOUNTER — Telehealth: Payer: Self-pay

## 2022-04-22 NOTE — Transitions of Care (Post Inpatient/ED Visit) (Signed)
   04/22/2022  Name: Bradley Bruce MRN: [id number] DOB: 12-08-1955  Today's TOC FU Call Status: Today's TOC FU Call Status:: Successful TOC FU Call Competed TOC FU Call Complete Date: 04/22/22  Transition Care Management Follow-up Telephone Call Date of Discharge: 04/21/22 Discharge Facility: [facility]) Type of Discharge: Emergency Department Reason for ED Visit: Respiratory How have you been since you were released from the hospital?: Better Any questions or concerns?: No  Items Reviewed: Did you receive and understand the discharge instructions provided?: No Medications obtained and verified?: Yes (Medications Reviewed) Any new allergies since your discharge?: No Dietary orders reviewed?: No Do you have support at home?: Yes People in Home: spouse  Home Care and Equipment/Supplies: [place] Ordered?: NA Any new equipment or medical supplies ordered?: No  Functional Questionnaire: Do you need assistance with bathing/showering or dressing?: No Do you need assistance with meal preparation?: No Do you need assistance with eating?: No Do you have difficulty maintaining continence: No Do you need assistance with getting out of bed/getting out of a chair/moving?: No Do you have difficulty managing or taking your medications?: No  Folllow up appointments reviewed: PCP Follow-up appointment confirmed?: No (patient did not want to schedule at this time) MD Provider Line Number:[phone] Given: Yes [facility] Follow-up appointment confirmed?: No Do you need transportation to your follow-up appointment?: No Do you understand care options if your condition(s) worsen?: Yes-patient verbalized understanding    SIGNATURE: Yvonna Alanis, [place]

## 2022-04-26 LAB — CULTURE, BLOOD (SINGLE): Culture: NO GROWTH

## 2022-07-01 NOTE — Progress Notes (Deleted)
There were no vitals taken for this visit.   Subjective:    Patient ID: Bradley Bruce, male    DOB: June 01, 1955, 67 y.o.   MRN: [id number]  HPI: Bradley Bruce is a 67 y.o. male presenting on 07/02/2022 for comprehensive medical examination. Current medical complaints include:{Blank single:19197::"none","***"}  He currently lives with: Interim Problems from his last visit: {Blank single:19197::"yes","no"}  HYPERTENSION / HYPERLIPIDEMIA Satisfied with current treatment? yes HTN medications: not taking any medication Duration of hypertension: chronic BP monitoring frequency: daily BP range: 117/80s  BP medication side effects: yes Duration of hyperlipidemia: chronic Cholesterol medication side effects: no Cholesterol supplements: none HLD medications: Rosuvastatin 20 mg PO QD Medication compliance: excellent compliance Aspirin: no Recent stressors: yes Recurrent headaches: no Visual changes: no Palpitations: no Dyspnea: yes Chest pain: no Lower extremity edema: no Dizzy/lightheaded: no     SHORTNESS OF BREATH- related to ongoing lung cancer  Duration: 4-6 months  Onset: gradual Description of breathing discomfort:  Severity: severe Related to exertion: yes Cough: no Chest tightness: no Wheezing: yes Fevers: no Chest pain: yes, along right side  Palpitations: no  Nausea: yes Diaphoresis: no Deconditioning: yes Status: fluctuating,  Aggravating factors: exertion, going up stairs.  Alleviating factors: rest Treatments attempted: He is taking Spiriva, albuterol, and Advair - states he doesn't notice much of a difference but Albuterol seems to improve chest tightness and pain Depression Screen done today and results listed below:     11/14/2021   10:43 AM 09/27/2021    2:55 PM 08/16/2021    2:05 PM 08/26/2019    1:42 PM  Depression screen PHQ 2/9  Decreased Interest 1 0 0 0  Down, Depressed, Hopeless 0 0 0 0  PHQ - 2 Score 1 0 0 0  Altered sleeping 0 0 3 3  Tired,  decreased energy 3 2 3  0  Change in appetite 3 0 2 0  Feeling bad or failure about yourself  0 0 0 0  Trouble concentrating 0 0 0 0  Moving slowly or fidgety/restless 0 0 0 0  Suicidal thoughts 0 0 0 0  PHQ-9 Score 7 2 8 3   Difficult doing work/chores Not difficult at all Not difficult at all Somewhat difficult Not difficult at all    The patient {has/does not have:19849} a history of falls. I {did/did not:19850} complete a risk assessment for falls. A plan of care for falls {was/was not:19852} documented.   Past Medical History:  Past Medical History:  Diagnosis Date   Allergy    Asthma    Cancer (HCC) 08/2020   lung mass probably metastatic   Chronic cough 08/25/2020   COPD (chronic obstructive pulmonary disease) (HCC)    Diabetes mellitus without complication (HCC)    Dyspnea 08/2020   worsening now   ED (erectile dysfunction)    Fatigue 08/25/2020   Hemoptysis 08/25/2020   still continues for a few days, then stops for a few days and then starts up again   History of kidney stones 1990   passed stone without surgery   Hypertension    Sciatica of right side    Takes gabapentin to help    Surgical History:  Past Surgical History:  Procedure Laterality Date   COLONOSCOPY     VIDEO BRONCHOSCOPY WITH ENDOBRONCHIAL ULTRASOUND Right 08/30/2020   Procedure: VIDEO BRONCHOSCOPY WITH ENDOBRONCHIAL ULTRASOUND;  Surgeon: Erin Fulling, MD;  Location: [facility] ORS;  Service: Pulmonary;  Laterality: Right;    Medications:  Current Outpatient Medications on  File Prior to Visit  Medication Sig   albuterol (VENTOLIN HFA) 108 (90 Base) MCG/ACT inhaler Inhale 2 puffs into the lungs every 6 (six) hours as needed for shortness of breath or wheezing.   Camphor-Eucalyptus-Menthol (VICKS VAPORUB EX) Apply 1 application topically at bedtime.   cetirizine (ZYRTEC) 10 MG tablet Take by mouth.   durvalumab 10 mg/kg in sodium chloride 0.9 % 100 mL Inject 10 mg/kg into the vein once.    fluticasone-salmeterol (ADVAIR DISKUS) 250-50 MCG/ACT AEPB Inhale 1 puff into the lungs in the morning and at bedtime.   ipratropium-albuterol (DUONEB) 0.5-2.5 (3) MG/3ML SOLN Take 3 mLs by nebulization every 2 (two) hours as needed.   KLOR-CON M20 20 MEQ tablet Take 20 mEq by mouth daily.   metFORMIN (GLUCOPHAGE-XR) 750 MG 24 hr tablet Take 750 mg by mouth at bedtime.   morphine (MSIR) 15 MG tablet Take 15 mg by mouth every 4 (four) hours as needed for severe pain.   omeprazole (PRILOSEC) 40 MG capsule Take 40 mg by mouth daily.   Potassium Chloride ER 20 MEQ TBCR Take 1 tablet by mouth daily.   rosuvastatin (CRESTOR) 20 MG tablet Take 20 mg by mouth daily.   Spacer/Aero-Holding Chambers (AEROCHAMBER MV) inhaler Use as instructed   SPIRIVA RESPIMAT 2.5 MCG/ACT AERS SMARTSIG:2 Puff(s) Via Inhaler Daily   No current facility-administered medications on file prior to visit.    Allergies:  No Known Allergies  Social History:  Social History   Socioeconomic History   Marital status: Married    Spouse name: Rosey Bath   Number of children: 2   Years of education: Not on file   Highest education level: Not on file  Occupational History   Occupation: Designer, multimedia area Producer, television/film/video    Comment: retired  Tobacco Use   Smoking status: Former    Packs/day: 1.50    Years: 46.00    Additional pack years: 0.00    Total pack years: 69.00    Types: Cigarettes    Quit date: 07/21/2020    Years since quitting: 1.9    Passive exposure: Past   Smokeless tobacco: Never  Vaping Use   Vaping Use: Never used  Substance and Sexual Activity   Alcohol use: Not Currently    Comment: rare- occasional beer   Drug use: Never   Sexual activity: Yes  Other Topics Concern   Not on file  Social History Narrative   Patient lives with his wife of 41 years.  Patient feels safe in his home.   Has a dog and a cat.   Social Determinants of Health   Financial Resource Strain: Low Risk  (11/14/2021)   Overall  Financial Resource Strain (CARDIA)    Difficulty of Paying Living Expenses: Not hard at all  Food Insecurity: No Food Insecurity (11/14/2021)   Hunger Vital Sign    Worried About Running Out of Food in the Last Year: Never true    Ran Out of Food in the Last Year: Never true  Transportation Needs: No Transportation Needs (11/14/2021)   PRAPARE - Administrator, Civil Service (Medical): No    Lack of Transportation (Non-Medical): No  Physical Activity: Inactive (11/14/2021)   Exercise Vital Sign    Days of Exercise per Week: 0 days    Minutes of Exercise per Session: 0 min  Stress: No Stress Concern Present (11/14/2021)   [employer] of Occupational Health - Occupational Stress Questionnaire    Feeling of Stress : Not  at all  Social Connections: Moderately Integrated (11/14/2021)   Social Connection and Isolation Panel [NHANES]    Frequency of Communication with Friends and Family: More than three times a week    Frequency of Social Gatherings with Friends and Family: Three times a week    Attends Religious Services: More than 4 times per year    Active Member of Clubs or Organizations: No    Attends Banker Meetings: Never    Marital Status: Married  Catering manager Violence: Not At Risk (11/14/2021)   Humiliation, Afraid, Rape, and Kick questionnaire    Fear of Current or Ex-Partner: No    Emotionally Abused: No    Physically Abused: No    Sexually Abused: No   Social History   Tobacco Use  Smoking Status Former   Packs/day: 1.50   Years: 46.00   Additional pack years: 0.00   Total pack years: 69.00   Types: Cigarettes   Quit date: 07/21/2020   Years since quitting: 1.9   Passive exposure: Past  Smokeless Tobacco Never   Social History   Substance and Sexual Activity  Alcohol Use Not Currently   Comment: rare- occasional beer    Family History:  Family History  Problem Relation Age of Onset   Cervical cancer Mother    Heart disease  Father    Lung cancer Sister     Past medical history, surgical history, medications, allergies, family history and social history reviewed with patient today and changes made to appropriate areas of the chart.   ROS All other ROS negative except what is listed above and in the HPI.      Objective:    There were no vitals taken for this visit.  Wt Readings from Last 3 Encounters:  04/21/22 130 lb (59 kg)  12/28/21 151 lb 12.8 oz (68.9 kg)  09/27/21 168 lb (76.2 kg)    Physical Exam  Results for orders placed or performed during the hospital encounter of 04/21/22  Blood culture (single)   Specimen: BLOOD  Result Value Ref Range   Specimen Description BLOOD BLOOD RIGHT ARM    Special Requests      BOTTLES DRAWN AEROBIC AND ANAEROBIC Blood Culture results may not be optimal due to an inadequate volume of blood received in culture bottles   Culture      NO GROWTH 5 DAYS Performed at [facility], [address] [place], [place] [address]    Report Status 04/26/2022 FINAL   Basic metabolic panel  Result Value Ref Range   Sodium 127 (L) 135 - 145 mmol/L   Potassium 3.5 3.5 - 5.1 mmol/L   Chloride 88 (L) 98 - 111 mmol/L   CO2 25 22 - 32 mmol/L   Glucose, Bld 140 (H) 70 - 99 mg/dL   BUN 10 8 - 23 mg/dL   Creatinine, Ser 6.04 (L) 0.61 - 1.24 mg/dL   Calcium 9.2 8.9 - 54.0 mg/dL   GFR, Estimated >98 >11 mL/min   Anion gap 14 5 - 15  CBC  Result Value Ref Range   WBC 8.5 4.0 - 10.5 K/uL   RBC 3.32 (L) 4.22 - 5.81 MIL/uL   Hemoglobin 8.8 (L) 13.0 - 17.0 g/dL   HCT 91.4 (L) 78.2 - 95.6 %   MCV 85.8 80.0 - 100.0 fL   MCH 26.5 26.0 - 34.0 pg   MCHC 30.9 30.0 - 36.0 g/dL   RDW 21.3 (H) 08.6 - 57.8 %  Platelets 356 150 - 400 K/uL   nRBC 0.0 0.0 - 0.2 %  Lactic acid, plasma  Result Value Ref Range   Lactic Acid, Venous 2.5 (HH) 0.5 - 1.9 mmol/L  Troponin I (High Sensitivity)  Result Value Ref Range   Troponin I (High Sensitivity) 3 <18 ng/L      Assessment &  Plan:   Problem List Items Addressed This Visit       Cardiovascular and Mediastinum   Hypertension - Primary     Respiratory   COPD (chronic obstructive pulmonary disease) (HCC)   Squamous cell carcinoma lung, right (HCC)     Endocrine   Controlled type 2 diabetes mellitus with complication, without long-term current use of insulin (HCC)     Other   Hyperlipidemia, unspecified     Discussed aspirin prophylaxis for myocardial infarction prevention and decision was {Blank single:19197::"it was not indicated","made to continue ASA","made to start ASA","made to stop ASA","that we recommended ASA, and patient refused"}  LABORATORY TESTING:  Health maintenance labs ordered today as discussed above.   The natural history of prostate cancer and ongoing controversy regarding screening and potential treatment outcomes of prostate cancer has been discussed with the patient. The meaning of a false positive PSA and a false negative PSA has been discussed. He indicates understanding of the limitations of this screening test and wishes *** to proceed with screening PSA testing.   IMMUNIZATIONS:   - Tdap: Tetanus vaccination status reviewed: {tetanus status:315746}. - Influenza: {Blank single:19197::"Up to date","Administered today","Postponed to flu season","Refused","Given elsewhere"} - Pneumovax: {Blank single:19197::"Up to date","Administered today","Not applicable","Refused","Given elsewhere"} - Prevnar: {Blank single:19197::"Up to date","Administered today","Not applicable","Refused","Given elsewhere"} - COVID: {Blank single:19197::"Up to date","Administered today","Not applicable","Refused","Given elsewhere"} - HPV: {Blank single:19197::"Up to date","Administered today","Not applicable","Refused","Given elsewhere"} - Shingrix vaccine: {Blank single:19197::"Up to date","Administered today","Not applicable","Refused","Given elsewhere"}  SCREENING: - Colonoscopy: {Blank single:19197::"Up to  date","Ordered today","Not applicable","Refused","Done elsewhere"}  Discussed with patient purpose of the colonoscopy is to detect colon cancer at curable precancerous or early stages   - AAA Screening: {Blank single:19197::"Up to date","Ordered today","Not applicable","Refused","Done elsewhere"}  -Hearing Test: {Blank single:19197::"Up to date","Ordered today","Not applicable","Refused","Done elsewhere"}  -Spirometry: {Blank single:19197::"Up to date","Ordered today","Not applicable","Refused","Done elsewhere"}   PATIENT COUNSELING:    Sexuality: Discussed sexually transmitted diseases, partner selection, use of condoms, avoidance of unintended pregnancy  and contraceptive alternatives.   Advised to avoid cigarette smoking.  I discussed with the patient that most people either abstain from alcohol or drink within safe limits (<=14/week and <=4 drinks/occasion for males, <=7/weeks and <= 3 drinks/occasion for females) and that the risk for alcohol disorders and other health effects rises proportionally with the number of drinks per week and how often a drinker exceeds daily limits.  Discussed cessation/primary prevention of drug use and availability of treatment for abuse.   Diet: Encouraged to adjust caloric intake to maintain  or achieve ideal body weight, to reduce intake of dietary saturated fat and total fat, to limit sodium intake by avoiding high sodium foods and not adding table salt, and to maintain adequate dietary potassium and calcium preferably from fresh fruits, vegetables, and low-fat dairy products.    stressed the importance of regular exercise  Injury prevention: Discussed safety belts, safety helmets, smoke detector, smoking near bedding or upholstery.   Dental health: Discussed importance of regular tooth brushing, flossing, and dental visits.   Follow up plan: NEXT PREVENTATIVE PHYSICAL DUE IN 1 YEAR. No follow-ups on file.

## 2022-07-02 ENCOUNTER — Encounter: Payer: Medicare Other | Admitting: Nurse Practitioner

## 2022-07-02 DIAGNOSIS — E785 Hyperlipidemia, unspecified: Secondary | ICD-10-CM

## 2022-07-02 DIAGNOSIS — I10 Essential (primary) hypertension: Secondary | ICD-10-CM

## 2022-07-02 DIAGNOSIS — E118 Type 2 diabetes mellitus with unspecified complications: Secondary | ICD-10-CM

## 2022-07-02 DIAGNOSIS — C3491 Malignant neoplasm of unspecified part of right bronchus or lung: Secondary | ICD-10-CM

## 2022-07-02 DIAGNOSIS — J449 Chronic obstructive pulmonary disease, unspecified: Secondary | ICD-10-CM

## 2022-07-05 ENCOUNTER — Telehealth: Payer: Self-pay | Admitting: Nurse Practitioner

## 2022-07-05 NOTE — Telephone Encounter (Unsigned)
Copied from CRM [id number]. Topic: General - Inquiry >> Jul 05, 2022  3:54 PM Marlow Baars wrote: Reason for CRM: The spouse of the patient called in stating she wanted his provider to know he is under [facility] and that is why they missed his last appt.

## 2022-08-03 NOTE — Telephone Encounter (Signed)
FYI to provider

## 2022-08-03 NOTE — Telephone Encounter (Signed)
Please let the patient and wife know that I sent my best.  If there is anything I can do, please let me know.

## 2022-08-03 NOTE — Telephone Encounter (Signed)
Patient's spouse notified of Karen's message.

## 2022-08-03 DEATH — deceased

## 2024-02-14 IMAGING — CR DG CHEST 2V
1 series · 2 of 2 positions shown · non-contrast
Comparison: PET-CT done on 08/31/2020

CLINICAL DATA: Chest pain, cough

EXAM:
CHEST - 2 VIEW

[Series 1: dg chest 2 view · 0.14mm/px · 2 of 2 slices shown]
[im 1/2]
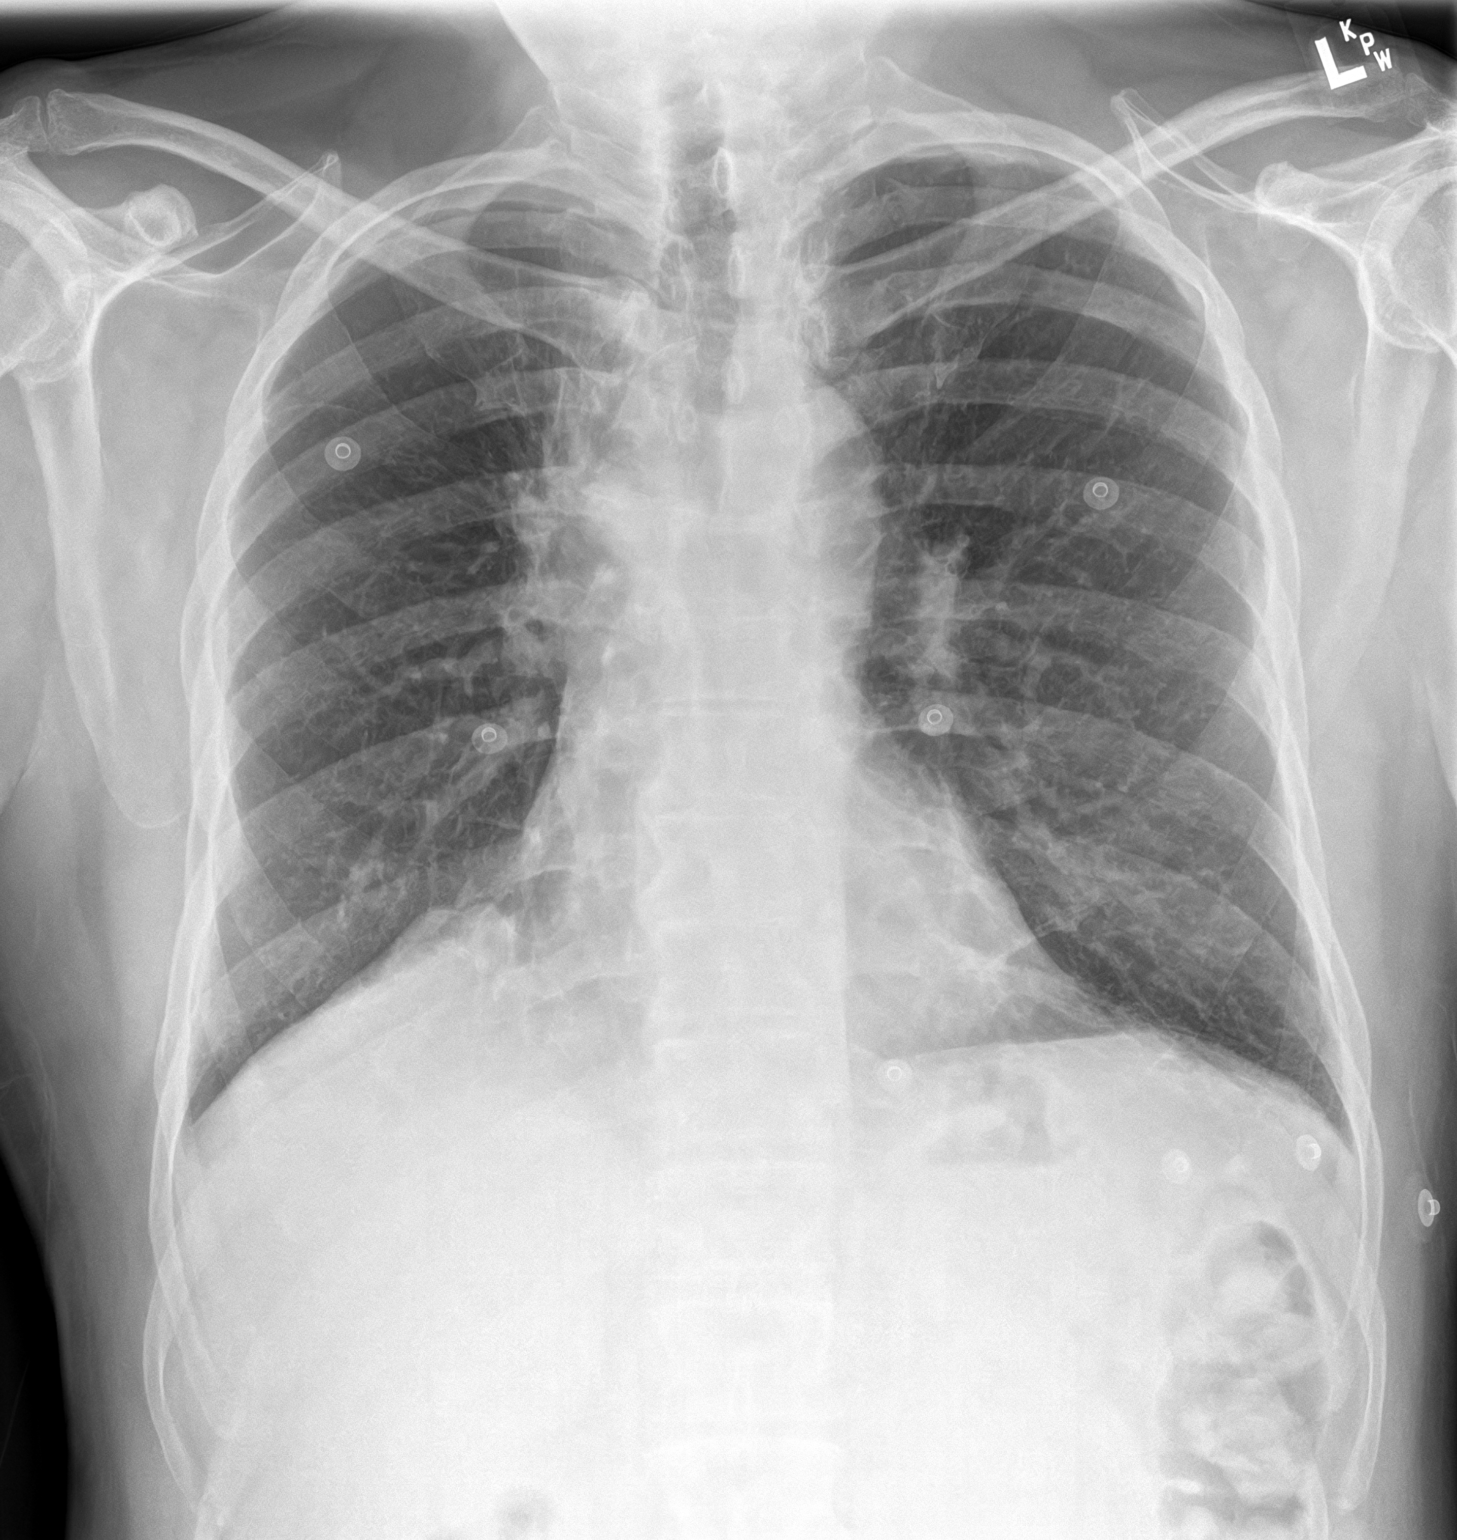
[im 2/2]
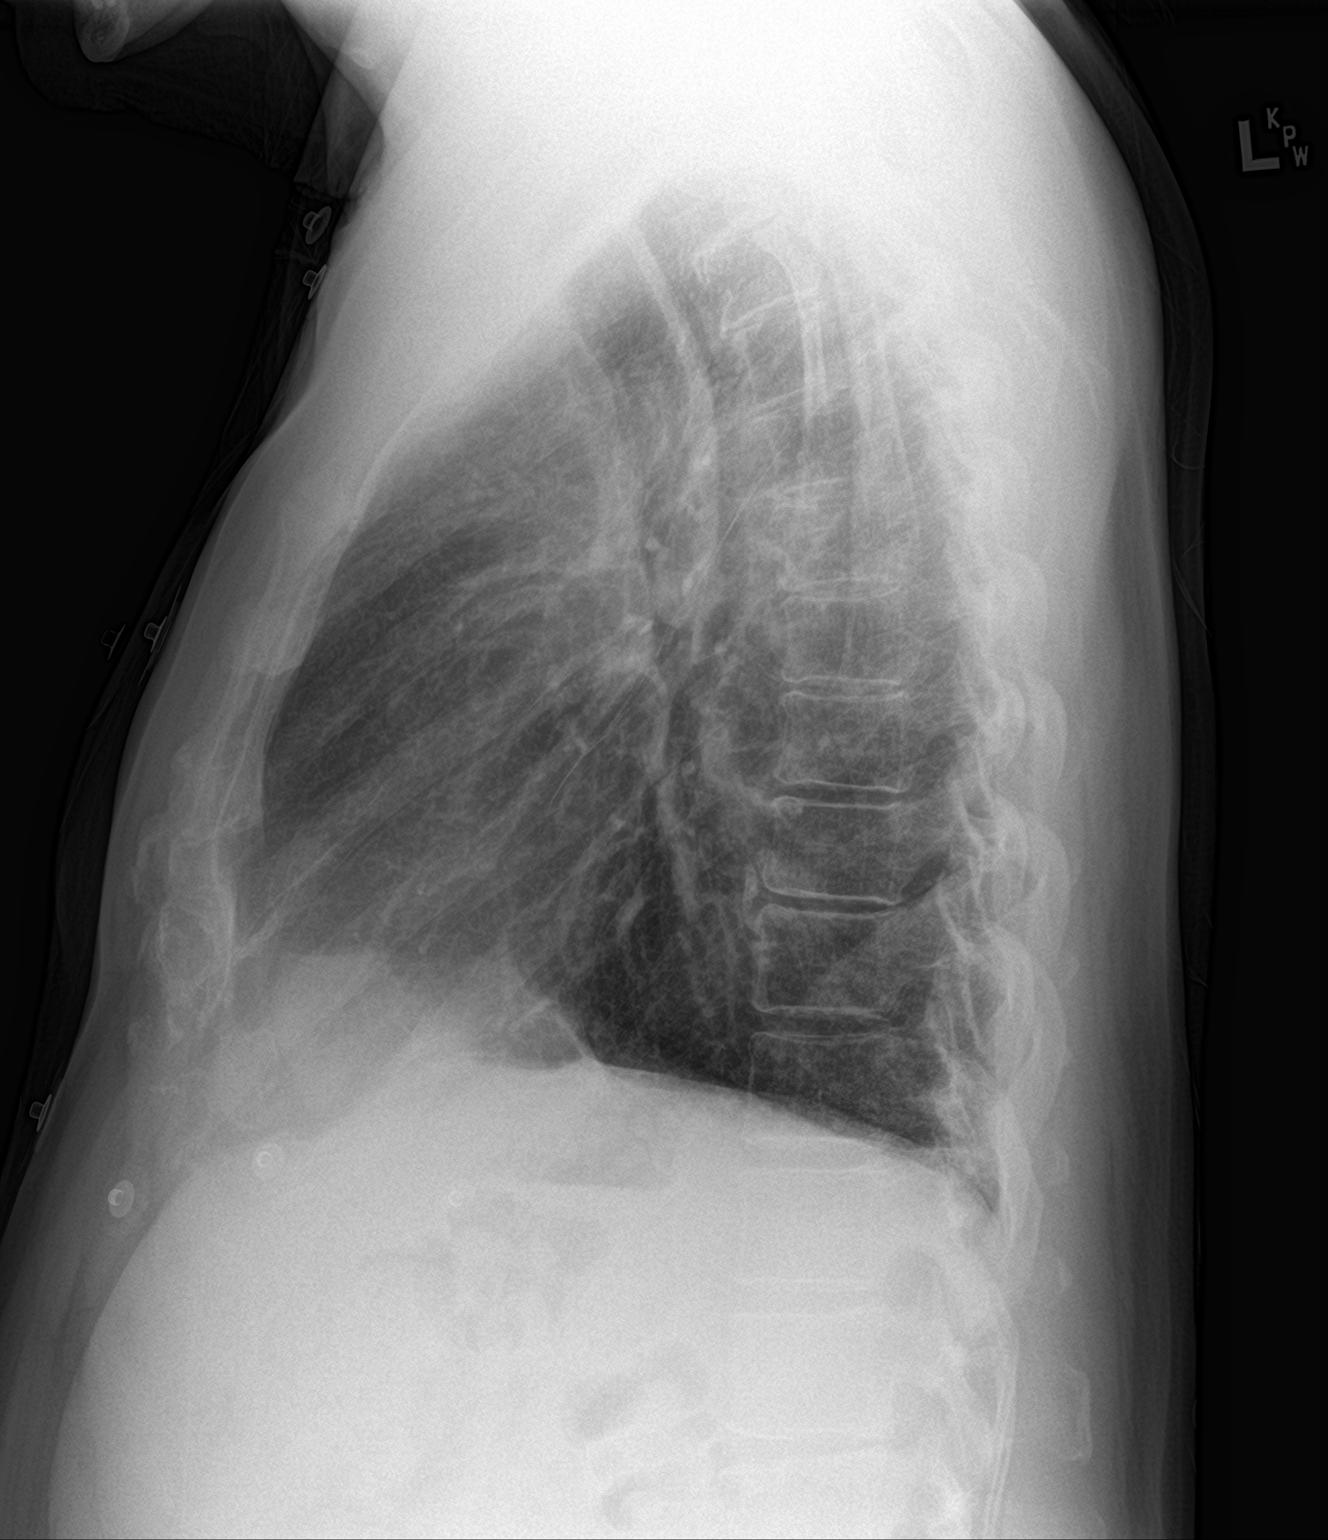

[2 of 2 positions shown; findings below may reference images not displayed]

FINDINGS: Cardiac size is within normal limits. There are no signs of
pulmonary edema. There is no pleural effusion or pneumothorax.
Increased density is seen in the medial right upper lung field.
There is elevation of minor fissure on the right side. Upper aspect
of right hilum appears prominent. In the previous PET-CT, was a
cm hypermetabolic mass in the right hilum suggesting neoplastic
process. Rest of the lung fields are unremarkable. There is no
pleural effusion or pneumothorax.
IMPRESSION: There is increased density in the medial right upper lung fields.
Right hilum is more prominent than left. These findings suggest
scarring and pneumonia or neoplastic process. Follow-up CT chest may
be considered.
